# Patient Record
Sex: Male | Born: 1995 | Race: Black or African American | Hispanic: No | Marital: Single | Smoking: Current every day smoker
Health system: Southern US, Community
[De-identification: ages and names within clinical notes are randomized; demographics above are authoritative.]

## PROBLEM LIST (undated history)

## (undated) DIAGNOSIS — I10 Essential (primary) hypertension: Secondary | ICD-10-CM

---
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
## 2013-06-19 ENCOUNTER — Emergency Department: Payer: Self-pay | Admitting: Emergency Medicine

## 2013-06-19 LAB — CBC
HCT: 45.9 % (ref 40.0–52.0)
HGB: 15.2 g/dL (ref 13.0–18.0)
MCH: 31.7 pg (ref 26.0–34.0)
MCHC: 33.2 g/dL (ref 32.0–36.0)
MCV: 96 fL (ref 80–100)
Platelet: 188 10*3/uL (ref 150–440)
RBC: 4.81 10*6/uL (ref 4.40–5.90)
RDW: 11.9 % (ref 11.5–14.5)
WBC: 10.3 10*3/uL (ref 3.8–10.6)

## 2013-06-19 LAB — URINALYSIS, COMPLETE
Bacteria: NONE SEEN
Bilirubin,UR: NEGATIVE
Blood: NEGATIVE
Glucose,UR: NEGATIVE mg/dL (ref 0–75)
KETONE: NEGATIVE
LEUKOCYTE ESTERASE: NEGATIVE
Nitrite: NEGATIVE
Ph: 6 (ref 4.5–8.0)
Protein: NEGATIVE
RBC,UR: <1 /HPF (ref 0–5)
Specific Gravity: 1.015 (ref 1.003–1.030)
Squamous Epithelial: NONE SEEN
WBC UR: 1 /HPF (ref 0–5)

## 2013-06-19 LAB — DRUG SCREEN, URINE

## 2013-06-19 LAB — BASIC METABOLIC PANEL
ANION GAP: 3 — AB (ref 7–16)
BUN: 8 mg/dL (ref 7–18)
Calcium, Total: 8.7 mg/dL — ABNORMAL LOW (ref 9.0–10.7)
Chloride: 108 mmol/L — ABNORMAL HIGH (ref 98–107)
Co2: 30 mmol/L (ref 21–32)
Creatinine: 0.99 mg/dL (ref 0.60–1.30)
Glucose: 121 mg/dL — ABNORMAL HIGH (ref 65–99)
Osmolality: 281 (ref 275–301)
Potassium: 3.4 mmol/L — ABNORMAL LOW (ref 3.5–5.1)
SODIUM: 141 mmol/L (ref 136–145)

## 2013-08-25 ENCOUNTER — Emergency Department: Payer: Self-pay | Admitting: Emergency Medicine

## 2013-12-31 ENCOUNTER — Emergency Department: Payer: Self-pay | Admitting: Emergency Medicine

## 2014-01-07 LAB — HM HIV SCREENING LAB: HM HIV Screening: NEGATIVE

## 2015-02-25 ENCOUNTER — Observation Stay
Admission: EM | Admit: 2015-02-25 | Discharge: 2015-02-26 | Disposition: A | Discharge status: Home / Self Care | Payer: Self-pay | Attending: Surgery | Admitting: Surgery

## 2015-02-25 ENCOUNTER — Emergency Department: Payer: Self-pay

## 2015-02-25 ENCOUNTER — Encounter: Payer: Self-pay | Admitting: Radiology

## 2015-02-25 DIAGNOSIS — R109 Unspecified abdominal pain: Secondary | ICD-10-CM | POA: Diagnosis present

## 2015-02-25 DIAGNOSIS — F17210 Nicotine dependence, cigarettes, uncomplicated: Secondary | ICD-10-CM | POA: Insufficient documentation

## 2015-02-25 DIAGNOSIS — Z79899 Other long term (current) drug therapy: Secondary | ICD-10-CM | POA: Insufficient documentation

## 2015-02-25 DIAGNOSIS — I10 Essential (primary) hypertension: Secondary | ICD-10-CM | POA: Insufficient documentation

## 2015-02-25 DIAGNOSIS — D72829 Elevated white blood cell count, unspecified: Secondary | ICD-10-CM | POA: Insufficient documentation

## 2015-02-25 DIAGNOSIS — K353 Acute appendicitis with localized peritonitis, without perforation or gangrene: Secondary | ICD-10-CM

## 2015-02-25 DIAGNOSIS — R51 Headache: Secondary | ICD-10-CM | POA: Insufficient documentation

## 2015-02-25 DIAGNOSIS — R1031 Right lower quadrant pain: Principal | ICD-10-CM | POA: Insufficient documentation

## 2015-02-25 HISTORY — DX: Essential (primary) hypertension: I10

## 2015-02-25 LAB — URINALYSIS COMPLETE WITH MICROSCOPIC (ARMC ONLY)
BILIRUBIN URINE: NEGATIVE
Bacteria, UA: NONE SEEN
Glucose, UA: NEGATIVE mg/dL
Hgb urine dipstick: NEGATIVE
KETONES UR: NEGATIVE mg/dL
LEUKOCYTES UA: NEGATIVE
NITRITE: NEGATIVE
PH: 7 (ref 5.0–8.0)
PROTEIN: NEGATIVE mg/dL
SPECIFIC GRAVITY, URINE: 1.02 (ref 1.005–1.030)
Squamous Epithelial / LPF: NONE SEEN

## 2015-02-25 LAB — CBC WITH DIFFERENTIAL/PLATELET
Basophils Absolute: 0.1 10*3/uL (ref 0–0.1)
Basophils Relative: 1 %
EOS ABS: 0.1 10*3/uL (ref 0–0.7)
EOS PCT: 0 %
HCT: 43.4 % (ref 40.0–52.0)
Hemoglobin: 14.5 g/dL (ref 13.0–18.0)
LYMPHS ABS: 1.9 10*3/uL (ref 1.0–3.6)
Lymphocytes Relative: 11 %
MCH: 31.8 pg (ref 26.0–34.0)
MCHC: 33.3 g/dL (ref 32.0–36.0)
MCV: 95.5 fL (ref 80.0–100.0)
MONO ABS: 1.8 10*3/uL — AB (ref 0.2–1.0)
Monocytes Relative: 11 %
Neutro Abs: 13.7 10*3/uL — ABNORMAL HIGH (ref 1.4–6.5)
Neutrophils Relative %: 77 %
PLATELETS: 149 10*3/uL — AB (ref 150–440)
RBC: 4.55 MIL/uL (ref 4.40–5.90)
RDW: 11.5 % (ref 11.5–14.5)
WBC: 17.6 10*3/uL — AB (ref 3.8–10.6)

## 2015-02-25 LAB — COMPREHENSIVE METABOLIC PANEL
ALT: 70 U/L — AB (ref 17–63)
ANION GAP: 8 (ref 5–15)
AST: 48 U/L — ABNORMAL HIGH (ref 15–41)
Albumin: 4.9 g/dL (ref 3.5–5.0)
Alkaline Phosphatase: 130 U/L — ABNORMAL HIGH (ref 38–126)
BUN: 10 mg/dL (ref 6–20)
CHLORIDE: 101 mmol/L (ref 101–111)
CO2: 29 mmol/L (ref 22–32)
Calcium: 9.4 mg/dL (ref 8.9–10.3)
Creatinine, Ser: 0.93 mg/dL (ref 0.61–1.24)
GFR calc non Af Amer: >60 mL/min (ref >60)
Glucose, Bld: 97 mg/dL (ref 65–99)
POTASSIUM: 3.9 mmol/L (ref 3.5–5.1)
SODIUM: 138 mmol/L (ref 135–145)
Total Bilirubin: 0.8 mg/dL (ref 0.3–1.2)
Total Protein: 8.5 g/dL — ABNORMAL HIGH (ref 6.5–8.1)

## 2015-02-25 LAB — LIPASE, BLOOD: LIPASE: 20 U/L (ref 11–51)

## 2015-02-25 MED ORDER — CEFOXITIN SODIUM 2 G IV SOLR: 2.0000 g | Freq: Once | INTRAVENOUS | Status: DC

## 2015-02-25 MED ORDER — ACETAMINOPHEN 325 MG PO TABS
ORAL_TABLET | ORAL | Status: AC
  Filled 2015-02-25: qty 2

## 2015-02-25 MED ORDER — MORPHINE SULFATE (PF) 4 MG/ML IV SOLN
4.0000 mg | Freq: Once | INTRAVENOUS | Status: AC
  Administered 2015-02-25: 4 mg via INTRAVENOUS
  Filled 2015-02-25: qty 1

## 2015-02-25 MED ORDER — PROCHLORPERAZINE EDISYLATE 5 MG/ML IJ SOLN
10.0000 mg | Freq: Once | INTRAMUSCULAR | Status: AC
  Administered 2015-02-25: 10 mg via INTRAVENOUS

## 2015-02-25 MED ORDER — SODIUM CHLORIDE 0.9 % IV BOLUS (SEPSIS)
1000.0000 mL | Freq: Once | INTRAVENOUS | Status: AC
  Administered 2015-02-25: 1000 mL via INTRAVENOUS

## 2015-02-25 MED ORDER — IOHEXOL 240 MG/ML SOLN
25.0000 mL | Freq: Once | INTRAMUSCULAR | Status: AC | PRN
  Administered 2015-02-25: 25 mL via ORAL
  Filled 2015-02-25: qty 25

## 2015-02-25 MED ORDER — SODIUM CHLORIDE 0.9 % IV BOLUS (SEPSIS): 1000.0000 mL | Freq: Once | INTRAVENOUS | Status: DC

## 2015-02-25 MED ORDER — DEXTROSE 5 % IV SOLN: 2.0000 g | INTRAVENOUS | Status: DC

## 2015-02-25 MED ORDER — PROCHLORPERAZINE EDISYLATE 5 MG/ML IJ SOLN
INTRAMUSCULAR | Status: AC
  Filled 2015-02-25: qty 2

## 2015-02-25 MED ORDER — ACETAMINOPHEN 325 MG PO TABS
650.0000 mg | ORAL_TABLET | Freq: Once | ORAL | Status: AC
  Administered 2015-02-25: 650 mg via ORAL

## 2015-02-25 MED ORDER — ONDANSETRON HCL 4 MG/2ML IJ SOLN
4.0000 mg | Freq: Once | INTRAMUSCULAR | Status: AC
  Administered 2015-02-25: 4 mg via INTRAVENOUS
  Filled 2015-02-25: qty 2

## 2015-02-25 MED ORDER — IOHEXOL 300 MG/ML  SOLN
100.0000 mL | Freq: Once | INTRAMUSCULAR | Status: AC | PRN
  Administered 2015-02-25: 100 mL via INTRAVENOUS
  Filled 2015-02-25: qty 100

## 2015-02-25 NOTE — Progress Notes (Signed)
Patient met in the emergency room at the request of the ED physician.. Patient's history is obtained and it is clear that while he has right lower quadrant pain and tenderness with leukocytosis and low-grade fever he's had similar episodes like this in the past he is been added in the emergency room recently with an elevated white blood cell count nausea vomiting and abdominal pain with that in mind I've suggested that we obtain a CT scan and I will see the patient after that should he be showing signs of appendicitis obviously he will required mission an appendectomy.

## 2015-02-25 NOTE — H&P (Signed)
Clinton Campbell is an 19 y.o. male.    Chief Complaint: Headache and abdominal pain  HPI: This a patient is chief complaint is headache and abdominal pain he's had some nausea but no emesis. He points to the suprapubic area is the site of maximal tenderness. I was called to see the patient concerning possible appendicitis and ultimately after seeing him in a CT scan was ordered which was equivocal. On review of the patient's past history in the chart it appears that he has been here approximately once a year for very similar symptoms where he's had a headache nausea vomiting and abdominal pain and in fact on one of those occasions he had a leukocytosis as well. His mother states that he has frequent headaches and has not been noncompliant with his hypertension medications. She also states that he shakes all the time and is worried about a neurologic problem.  History reviewed. No pertinent past medical history.  No past surgical history on file.  No family history on file. Social History:  has no tobacco, alcohol, and drug history on file.  Allergies: No Known Allergies   (Not in a hospital admission)   Review of Systems  Constitutional: Positive for fever and chills. Negative for weight loss and malaise/fatigue.  HENT: Negative for ear discharge, ear pain, hearing loss and tinnitus.        Neck pain neck stiffness  Eyes: Negative.   Respiratory: Negative.   Cardiovascular: Negative.   Gastrointestinal: Positive for nausea, abdominal pain and diarrhea. Negative for heartburn, vomiting, constipation, blood in stool and melena.  Genitourinary: Negative.   Musculoskeletal: Negative.   Skin: Negative.   Neurological: Positive for headaches.  Endo/Heme/Allergies: Negative.   Psychiatric/Behavioral: Negative.      Physical Exam:  BP 168/96 mmHg  Pulse 94  Temp(Src) 100.4 F (38 C) (Oral)  Ht _0  (1.676 m)  Wt 175 lb (79.379 kg)  BMI 28.26 kg/m2  SpO2 100%  Physical  Exam  Constitutional: He is oriented to person, place, and time and well-developed, well-nourished, and in no distress. No distress.  Appears quite comfortable  HENT:  Head: Normocephalic and atraumatic.  Eyes: Pupils are equal, round, and reactive to light. Right eye exhibits no discharge. Left eye exhibits no discharge. No scleral icterus.  Neck: Normal range of motion. Neck supple.  No nuchal rigidity  Cardiovascular: Normal rate, regular rhythm and normal heart sounds.   Pulmonary/Chest: Effort normal and breath sounds normal. No respiratory distress. He has no wheezes. He has no rales.  Abdominal: Soft. He exhibits no distension. There is tenderness. There is no rebound and no guarding.  Tenderness maximal in the right lower quadrant but not specifically localized to McBurney's point. There is no guarding and no rebound tenderness  Musculoskeletal: Normal range of motion. He exhibits no edema.  Lymphadenopathy:    He has no cervical adenopathy.  Neurological: He is alert and oriented to person, place, and time.  Skin: Skin is warm and dry. No rash noted. He is not diaphoretic. No erythema.  Psychiatric: Mood and affect normal.  Vitals reviewed.       Results for orders placed or performed during the hospital encounter of 02/25/15 (from the past 48 hour(s))  CBC WITH DIFFERENTIAL     Status: Abnormal   Collection Time: 02/25/15  8:36 PM  Result Value Ref Range   WBC 17.6 (H) 3.8 - 10.6 K/uL   RBC 4.55 4.40 - 5.90 MIL/uL   Hemoglobin 14.5 13.0 -  18.0 g/dL   HCT 43.4 40.0 - 52.0 %   MCV 95.5 80.0 - 100.0 fL   MCH 31.8 26.0 - 34.0 pg   MCHC 33.3 32.0 - 36.0 g/dL   RDW 11.5 11.5 - 14.5 %   Platelets 149 (L) 150 - 440 K/uL   Neutrophils Relative % 77 %   Neutro Abs 13.7 (H) 1.4 - 6.5 K/uL   Lymphocytes Relative 11 %   Lymphs Abs 1.9 1.0 - 3.6 K/uL   Monocytes Relative 11 %   Monocytes Absolute 1.8 (H) 0.2 - 1.0 K/uL   Eosinophils Relative 0 %   Eosinophils Absolute 0.1 0 -  0.7 K/uL   Basophils Relative 1 %   Basophils Absolute 0.1 0 - 0.1 K/uL  Comprehensive metabolic panel     Status: Abnormal   Collection Time: 02/25/15  8:36 PM  Result Value Ref Range   Sodium 138 135 - 145 mmol/L   Potassium 3.9 3.5 - 5.1 mmol/L   Chloride 101 101 - 111 mmol/L   CO2 29 22 - 32 mmol/L   Glucose, Bld 97 65 - 99 mg/dL   BUN 10 6 - 20 mg/dL   Creatinine, Ser 0.93 0.61 - 1.24 mg/dL   Calcium 9.4 8.9 - 10.3 mg/dL   Total Protein 8.5 (H) 6.5 - 8.1 g/dL   Albumin 4.9 3.5 - 5.0 g/dL   AST 48 (H) 15 - 41 U/L   ALT 70 (H) 17 - 63 U/L   Alkaline Phosphatase 130 (H) 38 - 126 U/L   Total Bilirubin 0.8 0.3 - 1.2 mg/dL   GFR calc non Af Amer >60 >60 mL/min   GFR calc Af Amer >60 >60 mL/min    Comment: (NOTE) The eGFR has been calculated using the CKD EPI equation. This calculation has not been validated in all clinical situations. eGFR's persistently <60 mL/min signify possible Chronic Kidney Disease.    Anion gap 8 5 - 15  Lipase, blood     Status: None   Collection Time: 02/25/15  8:36 PM  Result Value Ref Range   Lipase 20 11 - 51 U/L  Urinalysis complete, with microscopic ([facility] only)     Status: Abnormal   Collection Time: 02/25/15  8:36 PM  Result Value Ref Range   Color, Urine YELLOW (A) YELLOW   APPearance CLEAR (A) CLEAR   Glucose, UA NEGATIVE NEGATIVE mg/dL   Bilirubin Urine NEGATIVE NEGATIVE   Ketones, ur NEGATIVE NEGATIVE mg/dL   Specific Gravity, Urine 1.020 1.005 - 1.030   Hgb urine dipstick NEGATIVE NEGATIVE   pH 7.0 5.0 - 8.0   Protein, ur NEGATIVE NEGATIVE mg/dL   Nitrite NEGATIVE NEGATIVE   Leukocytes, UA NEGATIVE NEGATIVE   RBC / HPF 0-5 0 - 5 RBC/hpf   WBC, UA 0-5 0 - 5 WBC/hpf   Bacteria, UA NONE SEEN NONE SEEN   Squamous Epithelial / LPF NONE SEEN NONE SEEN   Mucous PRESENT    Ct Abdomen Pelvis W Contrast  02/25/2015  CLINICAL DATA:  19 year old male with right lower quadrant abdominal pain and leukocytosis. EXAM: CT ABDOMEN AND PELVIS  WITH CONTRAST TECHNIQUE: Multidetector CT imaging of the abdomen and pelvis was performed using the standard protocol following bolus administration of intravenous contrast. CONTRAST:  19m OMNIPAQUE IOHEXOL 300 MG/ML  SOLN COMPARISON:  None. FINDINGS: The visualized lung bases are clear. No intra-abdominal free air or free fluid. There multiple hepatic hypodense lesions measuring up to 1.9 x 1.9 cm in the  left lobe of the liver these lesions are not well characterized on this study but may represent hemangiomas. MRI is recommended for further characterization. The gallbladder, pancreas, spleen, adrenal glands, kidneys, visualized ureters, and urinary bladder appear unremarkable. The prostate and seminal vesicles are grossly unremarkable. There is no evidence of bowel obstruction or inflammation. The appendix is minimally enlarged measuring up to 8 mm in diameter. There is minimal haziness of the wall of the appendix without significant periappendiceal inflammation. And pockets of air noted within the lumen of the appendix as well as within the tip of the appendix. Although no definite CT evidence of acute appendicitis identified at this time, an early acute appendicitis is not excluded. Clinical correlation and follow-up recommended. The abdominal aorta and IVC appear patent. There is a retro aortic left renal vein variant anatomy. No portal venous gas identified. There is no adenopathy. There is slight prominence of the distal mesentery vasculature, nonspecific. The abdominal wall soft tissues appear unremarkable. The osseous structures are intact. IMPRESSION: Minimal thickening of the appendix without definite CT evidence of acute appendicitis at this time. An early acute appendicitis is not excluded. Clinical correlation and follow-up recommended. No evidence of bowel obstruction. Multiple hepatic hypodense lesions, not characterized on this CT. MRI is recommended for further evaluation. Electronically Signed    By: Anner Crete M.D.   On: 02/25/2015 23:15     Assessment/Plan  This a patient who when I visited him the second time his chief complaint became is headache and stated that his abdominal pain was much better. He scan was obtained and has been personally reviewed it is equivocal for appendicitis without any obvious signs of appendicitis. The patient has right lower quadrant pain and tenderness leukocytosis and equivocal CT scan and with that in mind he warrants admission to the hospital with observation. I'm concerned that he could have appendicitis and may require laparoscopy and appendectomy. However at this point reviewing his records and the fact that he's had other admissions to the emergency room with similar complaints over the last 2 years suggested this could be something different and in fact he has a low-grade fever without CT findings of appendicitis and headache makes me think that this is something other than simple appendicitis. I will last for prime doc consultation as well if for nothing else for his hypertension. But I would like him to give an opinion concerning his headaches etc.  If he requires laparoscopy that would be based on worsening abdominal pain and tenderness. This is discussed at length with emergency room physician. I also discussed this with multiple family members and answered questions for them  Florene Glen, MD, FACS

## 2015-02-25 NOTE — ED Notes (Signed)
Pt in with co headache also co upper abd pain, denies any n.v.d.

## 2015-02-25 NOTE — ED Notes (Signed)
Pt reports being d/x'd with HTN, but non-compliant with prescribed medication (HCTZ). Pt also reports RIGHT-sided upper and lower abdominal pain w/o N/V/D.

## 2015-02-25 NOTE — ED Provider Notes (Addendum)
[facility] Emergency Department Provider Note  ____________________________________________  Time seen: Approximately 905 PM  I have reviewed the triage vital signs and the nursing notes.   HISTORY  Chief Complaint Abdominal Pain    HPI Clinton Campbell is a 19 y.o. male without any chronic medical problems who is presenting with 2 days of intermittent right lower quadrant abdominal pain and headache. He denies any nausea vomiting or diarrhea. Says that he has had accompanying shaking chills along with the abdominal pain. He says at this point that the pain is 4 out of 5 into his right lower quadrant and radiates occasionally to his back and up to the right upper quadrant. He says that the headache comes with the shaking chills and is to the left side of his head but sometimes radiates into a generalized headache. He says it is a 5 out of 10 pain right now. The headache as well as been coming and going. No neck pain or difficulty moving his neck.   No past medical history on file.  There are no active problems to display for this patient.   No past surgical history on file.  No current outpatient prescriptions on file.  Allergies Review of patient's allergies indicates no known allergies.  No family history on file.  Social History Social History  Substance Use Topics  . Smoking status: Not on file  . Smokeless tobacco: Not on file  . Alcohol Use: Not on file    Review of Systems Constitutional: Shaking chills  Eyes: No visual changes. ENT: No sore throat. Cardiovascular: Denies chest pain. Respiratory: Denies shortness of breath. Gastrointestinal:  No nausea, no vomiting.  No diarrhea.  No constipation. Genitourinary: Negative for dysuria. Musculoskeletal: Negative for back pain. Skin: Negative for rash. Neurological: Negative for focal weakness or numbness.  10-point ROS otherwise  negative.  ____________________________________________   PHYSICAL EXAM:  VITAL SIGNS: ED Triage Vitals  Enc Vitals Group     BP 02/25/15 2027 168/96 mmHg     Pulse Rate 02/25/15 2027 94     Resp --      Temp 02/25/15 2027 100.4 F (38 C)     Temp Source 02/25/15 2027 Oral     SpO2 02/25/15 2027 100 %     Weight 02/25/15 2027 175 lb (79.379 kg)     Height 02/25/15 2027 5\' 6"  (1.676 m)     Head Cir --      Peak Flow --      Pain Score 02/25/15 2030 6     Pain Loc --      Pain Edu? --      Excl. in GC? --     Constitutional: Alert and oriented. Well appearing and in no acute distress. Eyes: Conjunctivae are normal. PERRL. EOMI. Head: Atraumatic. Nose: No congestion/rhinnorhea. Mouth/Throat: Mucous membranes are moist.  Oropharynx non-erythematous. Neck: No stridor.   Cardiovascular: Normal rate, regular rhythm. Grossly normal heart sounds.  Good peripheral circulation. Respiratory: Normal respiratory effort.  No retractions. Lungs CTAB. Gastrointestinal: Soft with right lower quadrant tenderness over McBurney's point. There is no right upper quadrant tenderness or Murphy sign. No distention. No abdominal bruits. No CVA tenderness. Musculoskeletal: No lower extremity tenderness nor edema.  No joint effusions. Neurologic:  Normal speech and language. No gross focal neurologic deficits are appreciated. No gait instability. Skin:  Skin is warm, dry and intact. No rash noted. Psychiatric: Mood and affect are normal. Speech and behavior are normal.  ____________________________________________  LABS (all labs ordered are listed, but only abnormal results are displayed)  Labs Reviewed  CBC WITH DIFFERENTIAL/PLATELET - Abnormal; Notable for the following:    WBC 17.6 (*)    Platelets 149 (*)    Neutro Abs 13.7 (*)    Monocytes Absolute 1.8 (*)    All other components within normal limits  URINALYSIS COMPLETEWITH MICROSCOPIC ([facility] ONLY) - Abnormal; Notable for the  following:    Color, Urine YELLOW (*)    APPearance CLEAR (*)    All other components within normal limits  COMPREHENSIVE METABOLIC PANEL  LIPASE, BLOOD   ____________________________________________  EKG   ____________________________________________  RADIOLOGY   ____________________________________________   PROCEDURES  ____________________________________________   INITIAL IMPRESSION / ASSESSMENT AND PLAN / ED COURSE  Pertinent labs & imaging results that were available during my care of the patient were reviewed by me and considered in my medical decision making (see chart for details).  ----------------------------------------- 9:20 PM on 02/25/2015 -----------------------------------------  Discussed case with Dr. Excell Seltzer that the patient has clinical appendicitis. I believe that the headaches are related to his low-grade fever. Dr. Excell Seltzer will be down to the patient. I discussed the diagnosis with the patient understands the plan for likely admission for surgery. He has no allergies to medication and he'll be given pain medicine as well as antiemetics and antibiotics. ____________________________________________   FINAL CLINICAL IMPRESSION(S) / ED DIAGNOSES  Acute appendicitis.    Myrna Blazer, MD 02/25/15 2120  Discussed with radiologist who says that the scan was equivocal. Relayed this information to Dr. Excell Seltzer who initially did not want to take the patient directed to the operating room because he said he had a similar visit several years ago with an elevated white count and did not want a empirically remove the appendix. Patient is aware of CAT scan results not showing definitive appendicitis the need for further observation. Patient is resting comfortably right now is not requesting any other further pain medications.  Myrna Blazer, MD 02/25/15 2310  Patient now with improved abdominal pain but worsening headache. Reexamined and no  neck pain able to fully range his neck without any meningismus or pain to the neck. No Kernig's or Brudzinski sign. He is fully mentating without any focal deficits. Discussed this with Dr. Excell Seltzer and says he will be getting a medicine consult. Patient with multiple symptoms including abdominal pain and headache but without any focal signs of meningismus in his neck.  Myrna Blazer, MD 02/25/15 [phone]

## 2015-02-26 ENCOUNTER — Encounter: Payer: Self-pay | Admitting: *Deleted

## 2015-02-26 DIAGNOSIS — R51 Headache: Secondary | ICD-10-CM

## 2015-02-26 DIAGNOSIS — K353 Acute appendicitis with localized peritonitis, without perforation or gangrene: Secondary | ICD-10-CM | POA: Insufficient documentation

## 2015-02-26 DIAGNOSIS — R1031 Right lower quadrant pain: Secondary | ICD-10-CM

## 2015-02-26 DIAGNOSIS — R519 Headache, unspecified: Secondary | ICD-10-CM | POA: Insufficient documentation

## 2015-02-26 DIAGNOSIS — I10 Essential (primary) hypertension: Secondary | ICD-10-CM | POA: Insufficient documentation

## 2015-02-26 DIAGNOSIS — R109 Unspecified abdominal pain: Secondary | ICD-10-CM | POA: Diagnosis present

## 2015-02-26 MED ORDER — AMOXICILLIN-POT CLAVULANATE 500-125 MG PO TABS
1.0000 | ORAL_TABLET | Freq: Three times a day (TID) | ORAL | Status: DC
  Administered 2015-02-26: 500 mg via ORAL
  Filled 2015-02-26: qty 1

## 2015-02-26 MED ORDER — LACTATED RINGERS IV SOLN
INTRAVENOUS | Status: DC
  Administered 2015-02-26 (×2): via INTRAVENOUS

## 2015-02-26 MED ORDER — ONDANSETRON HCL 4 MG PO TABS: 4.0000 mg | ORAL_TABLET | Freq: Four times a day (QID) | ORAL | Status: DC | PRN

## 2015-02-26 MED ORDER — HYDROMORPHONE HCL 1 MG/ML IJ SOLN: 1.0000 mg | INTRAMUSCULAR | Status: DC | PRN

## 2015-02-26 MED ORDER — AMOXICILLIN-POT CLAVULANATE 500-125 MG PO TABS: 1.0000 | ORAL_TABLET | Freq: Three times a day (TID) | ORAL | Status: DC

## 2015-02-26 MED ORDER — INFLUENZA VAC SPLIT QUAD 0.5 ML IM SUSY: 0.5000 mL | PREFILLED_SYRINGE | INTRAMUSCULAR | Status: DC

## 2015-02-26 MED ORDER — PROPRANOLOL HCL ER 80 MG PO CP24
80.0000 mg | ORAL_CAPSULE | Freq: Every day | ORAL | Status: DC
  Administered 2015-02-26: 80 mg via ORAL
  Filled 2015-02-26: qty 1

## 2015-02-26 MED ORDER — PIPERACILLIN-TAZOBACTAM 3.375 G IVPB
3.3750 g | Freq: Three times a day (TID) | INTRAVENOUS | Status: DC
  Administered 2015-02-26 (×2): 3.375 g via INTRAVENOUS
  Filled 2015-02-26 (×4): qty 50

## 2015-02-26 MED ORDER — ACETAMINOPHEN 325 MG PO TABS: 650.0000 mg | ORAL_TABLET | ORAL | Status: DC | PRN

## 2015-02-26 MED ORDER — ONDANSETRON HCL 4 MG/2ML IJ SOLN: 4.0000 mg | Freq: Four times a day (QID) | INTRAMUSCULAR | Status: DC | PRN

## 2015-02-26 MED ORDER — PIPERACILLIN-TAZOBACTAM 3.375 G IVPB: 3.3750 g | Freq: Three times a day (TID) | INTRAVENOUS | Status: DC

## 2015-02-26 NOTE — Progress Notes (Signed)
Patient ID: Clinton Campbell, male   DOB: 07/07/1995, 19 y.o.   MRN: [id number] [facility]   PATIENT NAME: Clinton Campbell    MR#:  [id number]  DATE OF BIRTH:  12/11/1995  SUBJECTIVE:   Patient denies any abdominal pain. He continues to have a headache. medicine evaluation.  No nausea no vomiting. REVIEW OF SYSTEMS:   Review of Systems  Constitutional: Positive for fever.  Eyes: Negative for blurred vision and double vision.  Gastrointestinal: Negative for nausea, vomiting and abdominal pain.  Genitourinary: Negative for dysuria.  Neurological: Positive for headaches.    DRUG ALLERGIES:  No Known Allergies  VITALS:  Blood pressure 137/68, pulse 87, temperature 99.1 F (37.3 C), temperature source Oral, resp. rate 20, height 5\' 6"  (1.676 m), weight 184 lb 3.2 oz (83.553 kg), SpO2 98 %.  I/O last 3 completed shifts: In: 971 [I.V.:462; IV Piggyback:509] Out: 850 [Urine:850] Total I/O In: 467 [I.V.:467] Out: -    PHYSICAL EXAMINATION:  Physical Exam  Constitutional: He is oriented to person, place, and time and well-developed, well-nourished, and in no distress. No distress.  HENT:  Head: Normocephalic and atraumatic.  Eyes: Pupils are equal, round, and reactive to light. No scleral icterus.  Abdominal: Soft. He exhibits no distension. There is no tenderness. There is no rebound.  Neurological: He is oriented to person, place, and time.  Skin: He is not diaphoretic.  Psychiatric: Affect normal.      ASSESSMENT AND PLAN:   19 year old male with poorly controlled hypertension recurrent headaches abdominal pain and leukocytosis and a CT scan which is equivocal for acute appendicitis.  His examination of the abdomen is benign.  At present I do not see an indication for diagnostic laparoscopy.

## 2015-02-26 NOTE — ED Notes (Signed)
Report called [facility] floor nurse kelly rn.  Iv in place.  Family with pt.

## 2015-02-26 NOTE — Consult Note (Signed)
[facility]  Medical Consultation  [name] ZOX:[id number] DOB: 07/05/1995 DOA: 02/25/2015 PCP: [facility]   Requesting physician:  Dionne Miloichard Cooper Date of consultation: 02/26/15 Reason for consultation:  HA  CHIEF COMPLAINT:   Chief Complaint  Patient presents with  . Abdominal Pain    HISTORY OF PRESENT ILLNESS: Clinton Campbell  is a 19 y.o. male with a known history of  hypertension who states that he has not been taking his blood pressure medications was admitted by surgery for abdominal pain and CT suggested possible appendicitis. Patient reports that he has a history of having headaches intermittently every few days. He describes it as sharp type of frontal headaches. He also reports that he has visual changes with blurred vision with these. He reports that he also has difficulty with seeing far away. However he has not had eye exam. Patient also reports that he has episodes of shaking every few days he's been having this since he was very young. His mother requested neurology evaluation earlier. PAST MEDICAL HISTORY:   Past Medical History  Diagnosis Date  . Hypertension     PAST SURGICAL HISTORY: History reviewed. No pertinent past surgical history.  SOCIAL HISTORY:  Social History  Substance Use Topics  . Smoking status: Current Every Day Smoker -- 0.50 packs/day    Types: Cigarettes  . Smokeless tobacco: Not on file  . Alcohol Use: Not on file    FAMILY HISTORY: History reviewed. No pertinent family history.  DRUG ALLERGIES: No Known Allergies  REVIEW OF SYSTEMS:   CONSTITUTIONAL: No fever, fatigue or weakness.  EYES: Positive blurred , no double vision.  EARS, NOSE, AND THROAT: No tinnitus or ear pain.  RESPIRATORY: No cough, shortness of breath, wheezing or hemoptysis.  CARDIOVASCULAR: No chest pain, orthopnea, edema.  GASTROINTESTINAL: No nausea, vomiting, diarrhea or abdominal pain.  GENITOURINARY: No dysuria, hematuria.  ENDOCRINE:  No polyuria, nocturia,  HEMATOLOGY: No anemia, easy bruising or bleeding SKIN: No rash or lesion. MUSCULOSKELETAL: No joint pain or arthritis.   NEUROLOGIC: No tingling, numbness, weakness. Complains of intermittent sharp headaches PSYCHIATRY: No anxiety or depression.   MEDICATIONS AT HOME:  Prior to Admission medications   Medication Sig Start Date End Date Taking? Authorizing Provider  acetaminophen (TYLENOL) 325 MG tablet Take 650 mg by mouth every 6 (six) hours as needed.   Yes Historical Provider, MD      PHYSICAL EXAMINATION:   VITAL SIGNS: Blood pressure 132/71, pulse 114, temperature 99.4 F (37.4 C), temperature source Oral, resp. rate 20, height 5\' 6"  (1.676 m), weight 83.553 kg (184 lb 3.2 oz), SpO2 99 %.  GENERAL:  19 y.o.-year-old patient lying in the bed with no acute distress.  EYES: Pupils equal, round, reactive to light and accommodation. No scleral icterus. Extraocular muscles intact.  HEENT: Head atraumatic, normocephalic. Oropharynx and nasopharynx clear.  NECK:  Supple, no jugular venous distention. No thyroid enlargement, no tenderness.  LUNGS: Normal breath sounds bilaterally, no wheezing, rales,rhonchi or crepitation. No use of accessory muscles of respiration.  CARDIOVASCULAR: S1, S2 normal. No murmurs, rubs, or gallops.  ABDOMEN: Soft, nontender, nondistended. Bowel sounds present. No organomegaly or mass.  EXTREMITIES: No pedal edema, cyanosis, or clubbing.  NEUROLOGIC: Cranial nerves II through XII are intact. Muscle strength 5/5 in all extremities. Sensation intact. Gait not checked.  PSYCHIATRIC: The patient is alert and oriented x 3.  SKIN: No obvious rash, lesion, or ulcer.   LABORATORY PANEL:   CBC  Recent Labs Lab  02/25/15 2036  WBC 17.6*  HGB 14.5  HCT 43.4  PLT 149*  MCV 95.5  MCH 31.8  MCHC 33.3  RDW 11.5  LYMPHSABS 1.9  MONOABS 1.8*  EOSABS 0.1  BASOSABS 0.1    ------------------------------------------------------------------------------------------------------------------  Chemistries   Recent Labs Lab 02/25/15 2036  NA 138  K 3.9  CL 101  CO2 29  GLUCOSE 97  BUN 10  CREATININE 0.93  CALCIUM 9.4  AST 48*  ALT 70*  ALKPHOS 130*  BILITOT 0.8   ------------------------------------------------------------------------------------------------------------------ estimated creatinine clearance is 127.4 mL/min (by C-G formula based on Cr of 0.93). ------------------------------------------------------------------------------------------------------------------ No results for input(s): TSH, T4TOTAL, T3FREE, THYROIDAB in the last 72 hours.  Invalid input(s): FREET3   Coagulation profile No results for input(s): INR, PROTIME in the last 168 hours. ------------------------------------------------------------------------------------------------------------------- No results for input(s): DDIMER in the last 72 hours. -------------------------------------------------------------------------------------------------------------------  Cardiac Enzymes No results for input(s): CKMB, TROPONINI, MYOGLOBIN in the last 168 hours.  Invalid input(s): CK ------------------------------------------------------------------------------------------------------------------ Invalid input(s): POCBNP  ---------------------------------------------------------------------------------------------------------------  Urinalysis    Component Value Date/Time   COLORURINE YELLOW* 02/25/2015 2036   COLORURINE Yellow 06/19/2013 2258   APPEARANCEUR CLEAR* 02/25/2015 2036   APPEARANCEUR Clear 06/19/2013 2258   LABSPEC 1.020 02/25/2015 2036   LABSPEC 1.015 06/19/2013 2258   PHURINE 7.0 02/25/2015 2036   PHURINE 6.0 06/19/2013 2258   GLUCOSEU NEGATIVE 02/25/2015 2036   GLUCOSEU Negative 06/19/2013 2258   HGBUR NEGATIVE 02/25/2015 2036   HGBUR Negative  06/19/2013 2258   BILIRUBINUR NEGATIVE 02/25/2015 2036   BILIRUBINUR Negative 06/19/2013 2258   KETONESUR NEGATIVE 02/25/2015 2036   KETONESUR Negative 06/19/2013 2258   PROTEINUR NEGATIVE 02/25/2015 2036   PROTEINUR Negative 06/19/2013 2258   NITRITE NEGATIVE 02/25/2015 2036   NITRITE Negative 06/19/2013 2258   LEUKOCYTESUR NEGATIVE 02/25/2015 2036   LEUKOCYTESUR Negative 06/19/2013 2258     RADIOLOGY: Ct Abdomen Pelvis W Contrast  02/25/2015  CLINICAL DATA:  20 year old male with right lower quadrant abdominal pain and leukocytosis. EXAM: CT ABDOMEN AND PELVIS WITH CONTRAST TECHNIQUE: Multidetector CT imaging of the abdomen and pelvis was performed using the standard protocol following bolus administration of intravenous contrast. CONTRAST:  OMNIPAQUE IOHEXOL 300 MG/ML  SOLN COMPARISON:  None. FINDINGS: The visualized lung bases are clear. No intra-abdominal free air or free fluid. There multiple hepatic hypodense lesions measuring up to 1.9 x 1.9 cm in the left lobe of the liver these lesions are not well characterized on this study but may represent hemangiomas. MRI is recommended for further characterization. The gallbladder, pancreas, spleen, adrenal glands, kidneys, visualized ureters, and urinary bladder appear unremarkable. The prostate and seminal vesicles are grossly unremarkable. There is no evidence of bowel obstruction or inflammation. The appendix is minimally enlarged measuring up to 8 mm in diameter. There is minimal haziness of the wall of the appendix without significant periappendiceal inflammation. And pockets of air noted within the lumen of the appendix as well as within the tip of the appendix. Although no definite CT evidence of acute appendicitis identified at this time, an early acute appendicitis is not excluded. Clinical correlation and follow-up recommended. The abdominal aorta and IVC appear patent. There is a retro aortic left renal vein variant anatomy. No  portal venous gas identified. There is no adenopathy. There is slight prominence of the distal mesentery vasculature, nonspecific. The abdominal wall soft tissues appear unremarkable. The osseous structures are intact. IMPRESSION: Minimal thickening of the appendix without definite CT evidence of acute appendicitis at this time. An  early acute appendicitis is not excluded. Clinical correlation and follow-up recommended. No evidence of bowel obstruction. Multiple hepatic hypodense lesions, not characterized on this CT. MRI is recommended for further evaluation. Electronically Signed   By: Elgie Collard M.D.   On: 02/25/2015 23:15    EKG: Orders placed or performed in visit on 06/19/13  . EKG 12-Lead    IMPRESSION AND PLAN: Patient is a 19 year old admitted with abdominal pain who has chronic intermittent headaches  1. Headache differential include due to poor vision needs eye exam, also could be migraines patient describes some sort of shaking episodes but this is something that is been going on for a long time. We will ask neurology to see if there is any other therapies that could be offered. For now can use Tylenol when necessary.  2. Hypertension apparently patient was supposed to be on HCTZ but with this headache may use a blood pressure medication like propranolol that would also help prevent headaches as well.   3. Abdominal pain as per surgery  4. Nicotine addiction smoking cessation provided to the patient for minutes spent he is not interested in quitting not interested in nicotine replacement therapy.    All the records are reviewed and case discussed with ED provider. Management plans discussed with the patient, family and they are in agreement.  CODE STATUS:    Code Status Orders        Start     Ordered   02/26/15 0006  Full code   Continuous     02/26/15 0007       TOTAL TIME TAKING CARE OF THIS PATIENT:28min.    Auburn Bilberry M.D on 02/26/2015 at 1:12  PM  Between 7am to 6pm - Pager - [phone]  After 6pm go to [email or web address] - password [email or web address] [facility]  [place] [facility]  Office  [phone]  CC: Primary care physician; [facility]

## 2015-02-26 NOTE — Progress Notes (Signed)
Patient discharge teaching given, including activity, diet, follow-up appoints, and medications. Patient verbalized understanding of all discharge instructions. IV access was d/c'd. Vitals are stable. Skin is intact except as charted in most recent assessments. Pt to be escorted out by family, to be driven home by family.  Karsten RoLauren E Hobbs

## 2015-02-26 NOTE — Progress Notes (Signed)
No abdominal pain  Tolerating clears  IM appreciated  abd soft  Home today on 7 days augmentin  Resume home meds for BP.

## 2015-02-26 NOTE — Care Management Note (Signed)
Case Management Note  Patient Details  Name: Clinton Campbell MRN: [id number] Date of Birth: 07/24/1995  Subjective/Objective:      Provided uninsured Mr Clinton Campbell with a list of local medical and medication clinics who serve the uninsured. Also provided Mr Clinton Campbell [facility] street [facility]. Mr Clinton Campbell verbalized understanding. No discharge orders at this time.               Action/Plan:   Expected Discharge Date:                  Expected Discharge Plan:     In-House Referral:     Discharge planning Services     Post Acute Care Choice:    Choice offered to:     DME Arranged:    DME Agency:     HH Arranged:    HH Agency:     Status of Service:     Medicare Important Message Given:    Date Medicare IM Given:    Medicare IM give by:    Date Additional Medicare IM Given:    Additional Medicare Important Message give by:     If discussed at Long Length of Stay Meetings, dates discussed:    Additional Comments:  Gary Gabrielsen A, RN 02/26/2015, 12:15 PM

## 2015-02-26 NOTE — Progress Notes (Signed)
The patient is given a work excuse note date 12-22 and 12-23 2016   Raynald KempMark A Montrice Gracey, MD  [facility].  [phone]

## 2015-02-26 NOTE — Clinical Social Work Note (Signed)
CSW consulted to assist with medications. This has been referred to RN CM. Please reconsult if CSW is needed. York SpanielMonica Yulian Gosney MSW,LCSW [phone]

## 2015-03-09 NOTE — Discharge Summary (Signed)
Physician Discharge Summary  Patient ID: Clinton Campbell MRN: [id number] DOB/AGE: 22/06/1995 19 y.o.  Admit date: 02/25/2015 Discharge date:02/26/2015  Admission Diagnoses: Abdominal pain Headaches hypertension  Discharge Diagnoses:  Active Problems:   Abdominal pain Headaches. Hypertension.  Discharged Condition: stable  Hospital Course: Admitted with recurrent abdominal pain and headaches.  Clinical impression was not that of appendicitis.  Internal medicine saw him and adjusted his hypertension medications.  Repeated exam of abdomen showed no abdominal pain.  Diet advanced without difficulty.  Consults: IM  Significant Diagnostic Studies: CT scan  Discharge Exam: Blood pressure 117/72, pulse 120, temperature 99.4 F (37.4 C), temperature source Oral, resp. rate 20, height 5\' 6"  (1.676 m), weight 184 lb 3.2 oz (83.553 kg), SpO2 99 %. Soft non-tender abdomen.   Disposition: 01-Home or Self Care  Discharge Instructions    Call MD for:  persistant nausea and vomiting    Complete by:  As directed      Call MD for:  severe uncontrolled pain    Complete by:  As directed      Call MD for:  temperature >100.4    Complete by:  As directed      Diet - low sodium heart healthy    Complete by:  As directed      Increase activity slowly    Complete by:  As directed             Medication List    TAKE these medications        acetaminophen 325 MG tablet  Commonly known as:  TYLENOL  Take 650 mg by mouth every 6 (six) hours as needed.     amoxicillin-clavulanate 500-125 MG tablet  Commonly known as:  AUGMENTIN  Take 1 tablet (500 mg total) by mouth every 8 (eight) hours.         Signed: Natale LayMark Janeka Libman, MD FACS. 03/09/2015, 1:19 AM

## 2015-10-29 ENCOUNTER — Emergency Department
Admission: EM | Admit: 2015-10-29 | Discharge: 2015-10-29 | Disposition: A | Discharge status: Home / Self Care | Payer: Self-pay | Attending: Emergency Medicine | Admitting: Emergency Medicine

## 2015-10-29 DIAGNOSIS — F17210 Nicotine dependence, cigarettes, uncomplicated: Secondary | ICD-10-CM | POA: Insufficient documentation

## 2015-10-29 DIAGNOSIS — K529 Noninfective gastroenteritis and colitis, unspecified: Secondary | ICD-10-CM | POA: Insufficient documentation

## 2015-10-29 DIAGNOSIS — I10 Essential (primary) hypertension: Secondary | ICD-10-CM | POA: Insufficient documentation

## 2015-10-29 DIAGNOSIS — H1133 Conjunctival hemorrhage, bilateral: Secondary | ICD-10-CM | POA: Insufficient documentation

## 2015-10-29 DIAGNOSIS — R110 Nausea: Secondary | ICD-10-CM

## 2015-10-29 LAB — COMPREHENSIVE METABOLIC PANEL
ALK PHOS: 110 U/L (ref 38–126)
ALT: 43 U/L (ref 17–63)
AST: 42 U/L — AB (ref 15–41)
Albumin: 4.9 g/dL (ref 3.5–5.0)
Anion gap: 8 (ref 5–15)
BILIRUBIN TOTAL: 0.1 mg/dL — AB (ref 0.3–1.2)
BUN: 14 mg/dL (ref 6–20)
CHLORIDE: 106 mmol/L (ref 101–111)
CO2: 27 mmol/L (ref 22–32)
Calcium: 9.3 mg/dL (ref 8.9–10.3)
Creatinine, Ser: 0.94 mg/dL (ref 0.61–1.24)
GFR calc Af Amer: >60 mL/min (ref >60)
Glucose, Bld: 102 mg/dL — ABNORMAL HIGH (ref 65–99)
POTASSIUM: 3.4 mmol/L — AB (ref 3.5–5.1)
Sodium: 141 mmol/L (ref 135–145)
Total Protein: 8.1 g/dL (ref 6.5–8.1)

## 2015-10-29 LAB — URINALYSIS COMPLETE WITH MICROSCOPIC (ARMC ONLY)
Bacteria, UA: NONE SEEN
Bilirubin Urine: NEGATIVE
Glucose, UA: NEGATIVE mg/dL
HGB URINE DIPSTICK: NEGATIVE
KETONES UR: NEGATIVE mg/dL
LEUKOCYTES UA: NEGATIVE
Nitrite: NEGATIVE
PH: 5 (ref 5.0–8.0)
PROTEIN: NEGATIVE mg/dL
SPECIFIC GRAVITY, URINE: 1.03 (ref 1.005–1.030)

## 2015-10-29 LAB — CBC
HEMATOCRIT: 42.4 % (ref 40.0–52.0)
HEMOGLOBIN: 15 g/dL (ref 13.0–18.0)
MCH: 33.5 pg (ref 26.0–34.0)
MCHC: 35.4 g/dL (ref 32.0–36.0)
MCV: 94.5 fL (ref 80.0–100.0)
PLATELETS: 154 10*3/uL (ref 150–440)
RBC: 4.49 MIL/uL (ref 4.40–5.90)
RDW: 11.8 % (ref 11.5–14.5)
WBC: 9.1 10*3/uL (ref 3.8–10.6)

## 2015-10-29 LAB — LIPASE, BLOOD: LIPASE: 33 U/L (ref 11–51)

## 2015-10-29 MED ORDER — HYDROCHLOROTHIAZIDE 25 MG PO TABS: 25.0000 mg | ORAL_TABLET | Freq: Every day | ORAL | 0 refills | Status: DC

## 2015-10-29 MED ORDER — ONDANSETRON 4 MG PO TBDP
4.0000 mg | ORAL_TABLET | Freq: Once | ORAL | Status: AC
  Administered 2015-10-29: 4 mg via ORAL
  Filled 2015-10-29: qty 1

## 2015-10-29 MED ORDER — ONDANSETRON 4 MG PO TBDP: 4.0000 mg | ORAL_TABLET | Freq: Three times a day (TID) | ORAL | 0 refills | Status: DC | PRN

## 2015-10-29 NOTE — ED Provider Notes (Signed)
[facility] Emergency Department Provider Note   ____________________________________________   First MD Initiated Contact with Patient 10/29/15 [phone]     (approximate)  I have reviewed the triage vital signs and the nursing notes.   HISTORY  Chief Complaint Emesis    HPI Clinton Campbell is a 20 y.o. male who presents to the ED from home with a chief complain of nausea and ruptured blood vessels in both eyes. Patient reports 3-4 episodes of vomiting 2 days ago. He has had none since but has had residual nausea. Able to tolerate food. States he has had diarrhea for "a minute".2 days ago after vomiting patient noted ruptured blood vessels to his left eye. Yesterday noted ruptured blood vessel in his right eye. Denies vision changes, headache, fever, chills, neck pain, chest pain, shortness of breath, abdominal pain, dysuria, testicular pain or swelling. Denies recent travel or trauma. Nothing makes his symptoms better or worse.   Past Medical History:  Diagnosis Date  . Hypertension     Patient Active Problem List   Diagnosis Date Noted  . Abdominal pain 02/26/2015  . Acute appendicitis with localized peritonitis   . Essential hypertension   . Cephalalgia     No past surgical history on file.  Prior to Admission medications   Medication Sig Start Date End Date Taking? Authorizing Provider  acetaminophen (TYLENOL) 325 MG tablet Take 650 mg by mouth every 6 (six) hours as needed.    Historical Provider, MD  amoxicillin-clavulanate (AUGMENTIN) 500-125 MG tablet Take 1 tablet (500 mg total) by mouth every 8 (eight) hours. 02/26/15   Natale Lay, MD  hydrochlorothiazide (HYDRODIURIL) 25 MG tablet Take 1 tablet (25 mg total) by mouth daily. 10/29/15   Irean Hong, MD    Allergies Review of patient's allergies indicates no known allergies.  No family history on file.  Social History Social History  Substance Use Topics  . Smoking status: Current  Every Day Smoker    Packs/day: 0.50    Types: Cigarettes  . Smokeless tobacco: Not on file  . Alcohol use No    Review of Systems  Constitutional: No fever/chills.  Eyes: Positive for ruptured blood vessels in both eyes. No visual changes. ENT: No sore throat. Cardiovascular: Denies chest pain. Respiratory: Denies shortness of breath. Gastrointestinal: No abdominal pain.  Positive for nausea, vomiting and diarrhea.  No constipation. Genitourinary: Negative for dysuria. Musculoskeletal: Negative for back pain. Skin: Negative for rash. Neurological: Negative for headaches, focal weakness or numbness.  10-point ROS otherwise negative.  ____________________________________________   PHYSICAL EXAM:  VITAL SIGNS: ED Triage Vitals  Enc Vitals Group     BP 10/29/15 0110 (!) 167/113     Pulse Rate 10/29/15 0110 79     Resp 10/29/15 0110 18     Temp 10/29/15 0110 98.7 F (37.1 C)     Temp Source 10/29/15 0110 Oral     SpO2 10/29/15 0110 100 %     Weight 10/29/15 0111 170 lb (77.1 kg)     Height 10/29/15 0111 5\' 8"  (1.727 m)     Head Circumference --      Peak Flow --      Pain Score --      Pain Loc --      Pain Edu? --      Excl. in GC? --     Constitutional: Alert and oriented. Well appearing and in no acute distress. Eyes: Bilateral small subconjunctival hemorrhages. PERRL. EOMI.  Head: Atraumatic. Nose: No congestion/rhinnorhea. Mouth/Throat: Mucous membranes are moist.  Oropharynx non-erythematous. Neck: No stridor.   Cardiovascular: Normal rate, regular rhythm. Grossly normal heart sounds.  Good peripheral circulation. Respiratory: Normal respiratory effort.  No retractions. Lungs CTAB. Gastrointestinal: Soft and nontender to both light and deep palpation. No distention. No abdominal bruits. No CVA tenderness. Musculoskeletal: No lower extremity tenderness nor edema.  No joint effusions. Neurologic:  Normal speech and language. No gross focal neurologic deficits  are appreciated. No gait instability. Skin:  Skin is warm, dry and intact. No rash noted. Psychiatric: Mood and affect are normal. Speech and behavior are normal.  ____________________________________________   LABS (all labs ordered are listed, but only abnormal results are displayed)  Labs Reviewed  COMPREHENSIVE METABOLIC PANEL - Abnormal; Notable for the following:       Result Value   Potassium 3.4 (*)    Glucose, Bld 102 (*)    AST 42 (*)    Total Bilirubin 0.1 (*)    All other components within normal limits  URINALYSIS COMPLETEWITH MICROSCOPIC ([facility] ONLY) - Abnormal; Notable for the following:    Color, Urine YELLOW (*)    APPearance CLEAR (*)    Squamous Epithelial / LPF 0-5 (*)    All other components within normal limits  CBC  LIPASE, BLOOD   ____________________________________________  EKG  None ____________________________________________  RADIOLOGY  None ____________________________________________   PROCEDURES  Procedure(s) performed: None  Procedures  Critical Care performed: No  ____________________________________________   INITIAL IMPRESSION / ASSESSMENT AND PLAN / ED COURSE  Pertinent labs & imaging results that were available during my care of the patient were reviewed by me and considered in my medical decision making (see chart for details).  20 year old male presents with residual nausea after several episodes of vomiting and diarrhea 2 days ago. Also reports bilateral subconjunctival hemorrhages without affecting his vision. Will administer ODT Zofran. Laboratory urinalysis results are unremarkable. Abdomen is benign to both light and deep palpation; do not feel imaging studies are warranted at this time. Patient has been unable to afford his antihypertensive; will change to generic prescription on the [facility] $4 list. Patient to follow-up with both his PCP as well as ophthalmology next week. Strict return precautions given. Patient  verbalizes understanding and agrees with plan of care.  Clinical Course     ____________________________________________   FINAL CLINICAL IMPRESSION(S) / ED DIAGNOSES  Final diagnoses:  Nausea  Subconjunctival hemorrhage of both eyes  Gastroenteritis  Essential hypertension      NEW MEDICATIONS STARTED DURING THIS VISIT:  New Prescriptions   HYDROCHLOROTHIAZIDE (HYDRODIURIL) 25 MG TABLET    Take 1 tablet (25 mg total) by mouth daily.     Note:  This document was prepared using Dragon voice recognition software and may include unintentional dictation errors.    Irean HongJade J Mirel Hundal, MD 10/29/15 [phone]

## 2015-10-29 NOTE — ED Notes (Addendum)
Pt comes in reporting that two nights ago he had 4-5 episodes of vomiting, and in the last 2 or 3 episodes he had bright red blood in the emesis. Pt reports that since then he has had the feeling like he needs to vomit but he hasn't. Also reports that, "My stomach feels weak and like it's being pulled apart or like it's melting." After talking with the pt further, pt reports that he has been having loose, watery stools for quite awhile. Pt denies any blood in them, nor are they dark or tarry. Abd is not distended nor tender at time of assessment. Pt also reports that after the vomiting he noticed that he had some blood vessels in both eyes that were burst and that they have been concerning him. Pt is hypertensive, has a hx of HTN. Has been on medication in the past but is not currently on anything because the medications were $90/month and he could not afford that anymore. Pt reports that prior to having the vomiting he was having a headache as well as dizziness at times.

## 2015-10-29 NOTE — ED Notes (Signed)
Discharge instructions reviewed with patient. Patient verbalized understanding. Patient ambulated to lobby without difficulty.   

## 2015-10-29 NOTE — ED Triage Notes (Signed)
Pt vomited  2 days ago none since then but has had nausea. No diarrhea, no abd pain, but has ruptured blood vessels in both eyes that worsened today.

## 2015-10-29 NOTE — Discharge Instructions (Signed)
1. You may take nausea medicine as needed (Zofran #20). °2. Clear liquids ×12 hours, then BRAT diet ×3 days, then slowly advance diet as tolerated. °3. Return to the ER for worsening symptoms, persistent vomiting, difficulty breathing or other concerns. °

## 2016-05-03 ENCOUNTER — Emergency Department
Admission: EM | Admit: 2016-05-03 | Discharge: 2016-05-03 | Disposition: A | Discharge status: Home / Self Care | Payer: Self-pay | Attending: Emergency Medicine | Admitting: Emergency Medicine

## 2016-05-03 ENCOUNTER — Encounter: Payer: Self-pay | Admitting: *Deleted

## 2016-05-03 DIAGNOSIS — J029 Acute pharyngitis, unspecified: Secondary | ICD-10-CM | POA: Insufficient documentation

## 2016-05-03 DIAGNOSIS — F17210 Nicotine dependence, cigarettes, uncomplicated: Secondary | ICD-10-CM | POA: Insufficient documentation

## 2016-05-03 DIAGNOSIS — I10 Essential (primary) hypertension: Secondary | ICD-10-CM | POA: Insufficient documentation

## 2016-05-03 LAB — POCT RAPID STREP A: Streptococcus, Group A Screen (Direct): NEGATIVE

## 2016-05-03 MED ORDER — IBUPROFEN 800 MG PO TABS: 800.0000 mg | ORAL_TABLET | Freq: Three times a day (TID) | ORAL | 0 refills | Status: DC | PRN

## 2016-05-03 MED ORDER — IBUPROFEN 400 MG PO TABS
ORAL_TABLET | ORAL | Status: AC
  Filled 2016-05-03: qty 1

## 2016-05-03 MED ORDER — IBUPROFEN 800 MG PO TABS
800.0000 mg | ORAL_TABLET | Freq: Once | ORAL | Status: AC
  Administered 2016-05-03: 800 mg via ORAL

## 2016-05-03 MED ORDER — IBUPROFEN 800 MG PO TABS
ORAL_TABLET | ORAL | Status: AC
  Filled 2016-05-03: qty 1

## 2016-05-03 NOTE — ED Triage Notes (Signed)
Pt has sore throat for 3 days.  Pt reports pain when swallowing.  Pt also has left earache.  Pt alert

## 2016-05-03 NOTE — ED Provider Notes (Signed)
[facility]-EMERGENCY DEPARTMENT Provider Note   CSN: [id number] Arrival date & time: 05/03/16  2205     History   Chief Complaint Chief Complaint  Patient presents with  . Sore Throat    HPI Clinton Campbell is a 21 y.o. male presents to the emergency department for evaluation of sore throat 3 days. Patient has had moderate to severe sore throat with mild cough one day ago he's also had some left ear pain. Patient has had subjective low-grade fevers. He last took ibuprofen yesterday. No ibuprofen today. No difficulty swallowing. No chest pain or shortness of breath. Patient denies any headaches, body aches or rashes.  HPI  Past Medical History:  Diagnosis Date  . Hypertension     Patient Active Problem List   Diagnosis Date Noted  . Abdominal pain 02/26/2015  . Acute appendicitis with localized peritonitis   . Essential hypertension   . Cephalalgia     No past surgical history on file.     Home Medications    Prior to Admission medications   Medication Sig Start Date End Date Taking? Authorizing Provider  acetaminophen (TYLENOL) 325 MG tablet Take 650 mg by mouth every 6 (six) hours as needed.    Historical Provider, MD  amoxicillin-clavulanate (AUGMENTIN) 500-125 MG tablet Take 1 tablet (500 mg total) by mouth every 8 (eight) hours. 02/26/15   Natale LayMark Bird, MD  hydrochlorothiazide (HYDRODIURIL) 25 MG tablet Take 1 tablet (25 mg total) by mouth daily. 10/29/15   Irean HongJade J Sung, MD  ibuprofen (ADVIL,MOTRIN) 800 MG tablet Take 1 tablet (800 mg total) by mouth every 8 (eight) hours as needed. 05/03/16   Evon Slackhomas C Gaines, PA-C  ondansetron (ZOFRAN ODT) 4 MG disintegrating tablet Take 1 tablet (4 mg total) by mouth every 8 (eight) hours as needed for nausea or vomiting. 10/29/15   Irean HongJade J Sung, MD    Family History No family history on file.  Social History Social History  Substance Use Topics  . Smoking status: Current Every Day Smoker    Packs/day: 0.50    Types: Cigarettes    . Smokeless tobacco: Never Used  . Alcohol use No     Allergies   Patient has no known allergies.   Review of Systems Review of Systems  Constitutional: Positive for fever (subjective). Negative for activity change, appetite change and chills.  HENT: Positive for rhinorrhea and sore throat. Negative for congestion, drooling, ear pain, mouth sores, sinus pressure and trouble swallowing.   Eyes: Negative for photophobia, pain and discharge.  Respiratory: Positive for cough. Negative for chest tightness and shortness of breath.   Cardiovascular: Negative for chest pain and leg swelling.  Gastrointestinal: Negative for abdominal distention, abdominal pain, diarrhea, nausea and vomiting.  Genitourinary: Negative for difficulty urinating and dysuria.  Musculoskeletal: Negative for arthralgias, back pain and gait problem.  Skin: Negative for color change and rash.  Neurological: Negative for dizziness and headaches.  Hematological: Negative for adenopathy.  Psychiatric/Behavioral: Negative for agitation and behavioral problems.     Physical Exam Updated Vital Signs BP (!) 153/80 (BP Location: Left Arm)   Pulse 96   Temp 99.2 F (37.3 C) (Oral)   Resp 18   Ht 5\' 8"  (1.727 m)   Wt 74.4 kg   SpO2 99%   BMI 24.94 kg/m   Physical Exam  Constitutional: He is oriented to person, place, and time. He appears well-developed and well-nourished.  HENT:  Head: Normocephalic and atraumatic.  Right Ear: External ear normal.  Left Ear: External ear normal.  Mouth/Throat: No uvula swelling. Posterior oropharyngeal erythema present. No oropharyngeal exudate, posterior oropharyngeal edema or tonsillar abscesses.  Eyes: Conjunctivae and EOM are normal. Pupils are equal, round, and reactive to light.  Neck: Normal range of motion. Neck supple.  Cardiovascular: Normal rate, regular rhythm, normal heart sounds and intact distal pulses.   Pulmonary/Chest: Effort normal and breath sounds normal.  No respiratory distress. He has no wheezes. He has no rales. He exhibits no tenderness.  Abdominal: Soft. Bowel sounds are normal. He exhibits no distension and no mass. There is no tenderness. There is no rebound and no guarding.  Musculoskeletal: Normal range of motion. He exhibits no edema or tenderness.  Lymphadenopathy:    He has cervical adenopathy (posterior cervical).  Neurological: He is alert and oriented to person, place, and time.  Skin: Skin is warm and dry.  Psychiatric: He has a normal mood and affect. His behavior is normal. Judgment and thought content normal.     ED Treatments / Results  Labs (all labs ordered are listed, but only abnormal results are displayed) Labs Reviewed  POCT RAPID STREP A    EKG  EKG Interpretation None       Radiology No results found.  Procedures Procedures (including critical care time)  Medications Ordered in ED Medications  ibuprofen (ADVIL,MOTRIN) tablet 800 mg (800 mg Oral Given 05/03/16 2226)     Initial Impression / Assessment and Plan / ED Course  I have reviewed the triage vital signs and the nursing notes.  Pertinent labs & imaging results that were available during my care of the patient were reviewed by me and considered in my medical decision making (see chart for details).     21 year old male with viral pharyngitis. He will alternate Tylenol and ibuprofen as needed. Increase fluids. Educated on signs and symptoms return to the ED for.  Final Clinical Impressions(s) / ED Diagnoses   Final diagnoses:  Viral pharyngitis    New Prescriptions New Prescriptions   IBUPROFEN (ADVIL,MOTRIN) 800 MG TABLET    Take 1 tablet (800 mg total) by mouth every 8 (eight) hours as needed.     Evon Slack, PA-C 05/03/16 2244    Governor Rooks, MD 05/05/16 [phone]

## 2016-05-03 NOTE — Discharge Instructions (Signed)
Please alternate Tylenol and ibuprofen as needed for sore throat. Return to the ER for difficulty swallowing fevers above 102 with no improvement with Tylenol or ibuprofen, worsening symptoms urgent changes in her health.

## 2017-09-24 ENCOUNTER — Other Ambulatory Visit: Payer: Self-pay

## 2017-09-24 ENCOUNTER — Emergency Department
Admission: EM | Admit: 2017-09-24 | Discharge: 2017-09-24 | Disposition: A | Discharge status: Home / Self Care | Payer: Self-pay | Attending: Emergency Medicine | Admitting: Emergency Medicine

## 2017-09-24 ENCOUNTER — Encounter: Payer: Self-pay | Admitting: Intensive Care

## 2017-09-24 DIAGNOSIS — Z79899 Other long term (current) drug therapy: Secondary | ICD-10-CM | POA: Insufficient documentation

## 2017-09-24 DIAGNOSIS — I10 Essential (primary) hypertension: Secondary | ICD-10-CM | POA: Insufficient documentation

## 2017-09-24 DIAGNOSIS — F410 Panic disorder [episodic paroxysmal anxiety] without agoraphobia: Secondary | ICD-10-CM | POA: Insufficient documentation

## 2017-09-24 DIAGNOSIS — F17210 Nicotine dependence, cigarettes, uncomplicated: Secondary | ICD-10-CM | POA: Insufficient documentation

## 2017-09-24 MED ORDER — HYDROCHLOROTHIAZIDE 25 MG PO TABS
25.0000 mg | ORAL_TABLET | Freq: Once | ORAL | Status: AC
  Administered 2017-09-24: 25 mg via ORAL
  Filled 2017-09-24 (×2): qty 1

## 2017-09-24 MED ORDER — HYDROCHLOROTHIAZIDE 25 MG PO TABS: 25.0000 mg | ORAL_TABLET | Freq: Every day | ORAL | 1 refills | Status: DC

## 2017-09-24 NOTE — ED Notes (Signed)
Pt reports his body feels "jittery" although he is now calm. Pt states he had argued with his brother and was sitting at the table when he started breathing very hard. Then pt remembers being at fire dept, them telling him his BP is high and to come to ER. Pt states last time he had anxiety attack in 2013. Denies N/V. Reports headache at this time.

## 2017-09-24 NOTE — ED Notes (Signed)
Pt ambulatory upon discharge with family; declined wheel chair. Pt and family verbalized understanding of discharge instructions, follow-up care and prescription. VSS. Skin warm and dry. A&O x4.

## 2017-09-24 NOTE — ED Provider Notes (Signed)
[facility] Emergency Department Provider Note  ____________________________________________  Time seen: Approximately 5:27 PM  I have reviewed the triage vital signs and the nursing notes.   HISTORY  Chief Complaint Panic Attack   HPI Clinton Campbell is a 22 y.o. male with a history of hypertension who presents for evaluation of a panic attack.  Patient was having an argument with his brother at home when he started hyperventilating, shaking, crying inconsolably.  Symptoms were constant for several minutes and severe. Mother drove him to the fire department where he was found to have elevated blood pressure of 180/110 and they told patient to come to the emergency room.  Patient now feels like he is calm and back to baseline.  He does have a history of hypertension but has not been taking his medications because he cannot afford it.  Patient denies headache, dizziness, chest pain, shortness of breath, syncope.  He denies any suicidal homicidal ideations.  He denies any drug or alcohol use.  Past Medical History:  Diagnosis Date  . Hypertension     Patient Active Problem List   Diagnosis Date Noted  . Abdominal pain 02/26/2015  . Acute appendicitis with localized peritonitis   . Essential hypertension   . Cephalalgia     History reviewed. No pertinent surgical history.  Prior to Admission medications   Medication Sig Start Date End Date Taking? Authorizing Provider  acetaminophen (TYLENOL) 325 MG tablet Take 650 mg by mouth every 6 (six) hours as needed.    [provider]  amoxicillin-clavulanate (AUGMENTIN) 500-125 MG tablet Take 1 tablet (500 mg total) by mouth every 8 (eight) hours. 02/26/15   Natale Lay, MD  hydrochlorothiazide (HYDRODIURIL) 25 MG tablet Take 1 tablet (25 mg total) by mouth daily. 09/24/17   Nita Sickle, MD  ibuprofen (ADVIL,MOTRIN) 800 MG tablet Take 1 tablet (800 mg total) by mouth every 8 (eight) hours as needed.  05/03/16   Evon Slack, PA-C  ondansetron (ZOFRAN ODT) 4 MG disintegrating tablet Take 1 tablet (4 mg total) by mouth every 8 (eight) hours as needed for nausea or vomiting. 10/29/15   Irean Hong, MD    Allergies Patient has no known allergies.  History reviewed. No pertinent family history.  Social History Social History   Tobacco Use  . Smoking status: Current Every Day Smoker    Packs/day: 0.50    Types: Cigarettes  . Smokeless tobacco: Never Used  Substance Use Topics  . Alcohol use: No    Alcohol/week: 0.0 oz  . Drug use: No    Review of Systems  Constitutional: Negative for fever. Eyes: Negative for visual changes. ENT: Negative for sore throat. Neck: No neck pain  Cardiovascular: Negative for chest pain. Respiratory: Negative for shortness of breath. Gastrointestinal: Negative for abdominal pain, vomiting or diarrhea. Genitourinary: Negative for dysuria. Musculoskeletal: Negative for back pain. Skin: Negative for rash. Neurological: Negative for headaches, weakness or numbness. Psych: No SI or HI. + panic attack  ____________________________________________   PHYSICAL EXAM:  VITAL SIGNS: ED Triage Vitals  Enc Vitals Group     BP 09/24/17 1642 (!) 142/81     Pulse Rate 09/24/17 1642 84     Resp 09/24/17 1642 14     Temp 09/24/17 1642 99.2 F (37.3 C)     Temp Source 09/24/17 1642 Oral     SpO2 09/24/17 1642 97 %     Weight 09/24/17 1644 185 lb (83.9 kg)  Height 09/24/17 1644 5\' 8"  (1.727 m)     Head Circumference --      Peak Flow --      Pain Score 09/24/17 1643 0     Pain Loc --      Pain Edu? --      Excl. in GC? --     Constitutional: Alert and oriented. Well appearing and in no apparent distress. HEENT:      Head: Normocephalic and atraumatic.         Eyes: Conjunctivae are normal. Sclera is non-icteric.       Mouth/Throat: Mucous membranes are moist.       Neck: Supple with no signs of meningismus. Cardiovascular: Regular rate  and rhythm. No murmurs, gallops, or rubs. 2+ symmetrical distal pulses are present in all extremities. No JVD. Respiratory: Normal respiratory effort. Lungs are clear to auscultation bilaterally. No wheezes, crackles, or rhonchi.  Gastrointestinal: Soft, non tender, and non distended with positive bowel sounds. No rebound or guarding. Musculoskeletal: Nontender with normal range of motion in all extremities. No edema, cyanosis, or erythema of extremities. Neurologic: Normal speech and language. Face is symmetric. Moving all extremities. No gross focal neurologic deficits are appreciated. Skin: Skin is warm, dry and intact. No rash noted. Psychiatric: Mood and affect are normal. Speech and behavior are normal.  ____________________________________________   LABS (all labs ordered are listed, but only abnormal results are displayed)  Labs Reviewed - No data to display ____________________________________________  EKG  none  ____________________________________________  RADIOLOGY  none  ____________________________________________   PROCEDURES  Procedure(s) performed: None Procedures Critical Care performed:  None ____________________________________________   INITIAL IMPRESSION / ASSESSMENT AND PLAN / ED COURSE   22 y.o. male with a history of hypertension who presents for evaluation of a panic attack.  Patient is now calm and back to baseline.  No SI or HI.  His blood pressure slightly elevated but he is completely asymptomatic.  He is supposed to be on blood pressure medication but tells me that he is unable to afford it.  I explained to him that a 30-day supply of hydrochlorothiazide cost $5 and also discussed the importance of being on this medication to avoid developing kidney injury, strokes, and heart attacks.  At this time patient stable for discharge home to the care of his mother who is at the bedside with him.  I given him a dose of hydrochlorothiazide here and provided  him with a prescription.      As part of my medical decision making, I reviewed the following data within the electronic MEDICAL RECORD NUMBER History obtained from family, Nursing notes reviewed and incorporated, Old chart reviewed, Notes from prior ED visits and [place] Controlled Substance Database    Pertinent labs & imaging results that were available during my care of the patient were reviewed by me and considered in my medical decision making (see chart for details).    ____________________________________________   FINAL CLINICAL IMPRESSION(S) / ED DIAGNOSES  Final diagnoses:  Panic attack  Essential hypertension      NEW MEDICATIONS STARTED DURING THIS VISIT:  ED Discharge Orders        Ordered    hydrochlorothiazide (HYDRODIURIL) 25 MG tablet  Daily     09/24/17 1726       Note:  This document was prepared using Dragon voice recognition software and may include unintentional dictation errors.    Don PerkingVeronese, [place], MD 09/24/17 [phone]

## 2017-09-24 NOTE — ED Triage Notes (Addendum)
Mom reports getting home from work and patient had just had an argument with brother. Mom was driving patient home and reports he started "crying, shaking, and freaking out" so she drove him to the FD and they reported his b/p was high and got him to calm down. PAtient states he feels better now. Patient states he still feels worked up and nerves are bothing him. Denies HI/SI Patient normally takes HTN meds but does not have insurance so he has not gotten them refilled through PCP

## 2017-11-26 ENCOUNTER — Encounter: Payer: Self-pay | Admitting: Emergency Medicine

## 2017-11-26 ENCOUNTER — Other Ambulatory Visit: Payer: Self-pay

## 2017-11-26 ENCOUNTER — Emergency Department
Admission: EM | Admit: 2017-11-26 | Discharge: 2017-11-26 | Disposition: A | Discharge status: Home / Self Care | Payer: Self-pay | Attending: Emergency Medicine | Admitting: Emergency Medicine

## 2017-11-26 DIAGNOSIS — F17210 Nicotine dependence, cigarettes, uncomplicated: Secondary | ICD-10-CM | POA: Insufficient documentation

## 2017-11-26 DIAGNOSIS — J039 Acute tonsillitis, unspecified: Secondary | ICD-10-CM | POA: Insufficient documentation

## 2017-11-26 DIAGNOSIS — Z79899 Other long term (current) drug therapy: Secondary | ICD-10-CM | POA: Insufficient documentation

## 2017-11-26 DIAGNOSIS — I10 Essential (primary) hypertension: Secondary | ICD-10-CM | POA: Insufficient documentation

## 2017-11-26 LAB — GROUP A STREP BY PCR: Group A Strep by PCR: NOT DETECTED

## 2017-11-26 MED ORDER — AMOXICILLIN 500 MG PO CAPS: 500.0000 mg | ORAL_CAPSULE | Freq: Three times a day (TID) | ORAL | 0 refills | Status: DC

## 2017-11-26 MED ORDER — LIDOCAINE VISCOUS HCL 2 % MT SOLN: 5.0000 mL | Freq: Four times a day (QID) | OROMUCOSAL | 0 refills | Status: DC | PRN

## 2017-11-26 NOTE — ED Provider Notes (Signed)
[facility] Emergency Department Provider Note   ____________________________________________   First MD Initiated Contact with Patient 11/26/17 1232     (approximate)  I have reviewed the triage vital signs and the nursing notes.   HISTORY  Chief Complaint Sore Throat    HPI Clinton Campbell is a 22 y.o. male patient presented onset of acute sore throat for 1 day.  Patient denies URI signs and symptoms.  Patient denies being around anyone else that has been sick.  Patient states able to tolerate food and fluids with difficulty.  Patient rates his pain/ discomfort as a 7/10.  Patient describes his pain/discomfort as sore".  No palliative measures for complaint.  Past Medical History:  Diagnosis Date  . Hypertension     Patient Active Problem List   Diagnosis Date Noted  . Abdominal pain 02/26/2015  . Acute appendicitis with localized peritonitis   . Essential hypertension   . Cephalalgia     History reviewed. No pertinent surgical history.  Prior to Admission medications   Medication Sig Start Date End Date Taking? Authorizing Provider  acetaminophen (TYLENOL) 325 MG tablet Take 650 mg by mouth every 6 (six) hours as needed.    [provider]  amoxicillin (AMOXIL) 500 MG capsule Take 1 capsule (500 mg total) by mouth 3 (three) times daily. 11/26/17   Joni Reining, PA-C  amoxicillin-clavulanate (AUGMENTIN) 500-125 MG tablet Take 1 tablet (500 mg total) by mouth every 8 (eight) hours. 02/26/15   Natale Lay, MD  hydrochlorothiazide (HYDRODIURIL) 25 MG tablet Take 1 tablet (25 mg total) by mouth daily. 09/24/17   Nita Sickle, MD  ibuprofen (ADVIL,MOTRIN) 800 MG tablet Take 1 tablet (800 mg total) by mouth every 8 (eight) hours as needed. 05/03/16   Evon Slack, PA-C  lidocaine (XYLOCAINE) 2 % solution Use as directed 5 mLs in the mouth or throat every 6 (six) hours as needed for mouth pain. For oral swish. 11/26/17   Joni Reining, PA-C  ondansetron (ZOFRAN ODT) 4 MG disintegrating tablet Take 1 tablet (4 mg total) by mouth every 8 (eight) hours as needed for nausea or vomiting. 10/29/15   Irean Hong, MD    Allergies Patient has no known allergies.  No family history on file.  Social History Social History   Tobacco Use  . Smoking status: Current Every Day Smoker    Packs/day: 0.50    Types: Cigarettes  . Smokeless tobacco: Never Used  Substance Use Topics  . Alcohol use: No    Alcohol/week: 0.0 standard drinks  . Drug use: No    Review of Systems Constitutional: No fever/chills Eyes: No visual changes. ENT: Sore throat. Cardiovascular: Denies chest pain. Respiratory: Denies shortness of breath. Gastrointestinal: No abdominal pain.  No nausea, no vomiting.  No diarrhea.  No constipation. Genitourinary: Negative for dysuria. Musculoskeletal: Negative for back pain. Skin: Negative for rash. Neurological: Negative for headaches, focal weakness or numbness.   ____________________________________________   PHYSICAL EXAM:  VITAL SIGNS: ED Triage Vitals [11/26/17 1159]  Enc Vitals Group     BP      Pulse      Resp      Temp      Temp src      SpO2      Weight 184 lb 15.5 oz (83.9 kg)     Height      Head Circumference      Peak Flow  Pain Score 7     Pain Loc      Pain Edu?      Excl. in GC?    Constitutional: Alert and oriented. Well appearing and in no acute distress. Eyes: Conjunctivae are normal. PERRL. EOMI. Head: Atraumatic. Nose: No congestion/rhinnorhea. Mouth/Throat: Mucous membranes are moist.  Oropharynx erythematous.  Edematous exudative bilateral tonsils. Neck: No stridor.  No cervical spine tenderness to palpation. Hematological/Lymphatic/Immunilogical: No cervical lymphadenopathy. Cardiovascular: Normal rate, regular rhythm. Grossly normal heart sounds.  Good peripheral circulation. Respiratory: Normal respiratory effort.  No retractions. Lungs CTAB. Skin:   Skin is warm, dry and intact. No rash noted. Psychiatric: Mood and affect are normal. Speech and behavior are normal.  ____________________________________________   LABS (all labs ordered are listed, but only abnormal results are displayed)  Labs Reviewed  GROUP A STREP BY PCR   ____________________________________________  EKG   ____________________________________________  RADIOLOGY  ED MD interpretation:    Official radiology report(s): No results found.  ____________________________________________   PROCEDURES  Procedure(s) performed: None  Procedures  Critical Care performed: No  ____________________________________________   INITIAL IMPRESSION / ASSESSMENT AND PLAN / ED COURSE  As part of my medical decision making, I reviewed the following data within the electronic MEDICAL RECORD NUMBER    Sore throat secondary to tonsillitis.  Patient given discharge care instruction advised take medication as directed.  Patient given a work note and advised [facility].      ____________________________________________   FINAL CLINICAL IMPRESSION(S) / ED DIAGNOSES  Final diagnoses:  Tonsillitis     ED Discharge Orders         Ordered    amoxicillin (AMOXIL) 500 MG capsule  3 times daily     11/26/17 1349    lidocaine (XYLOCAINE) 2 % solution  Every 6 hours PRN     11/26/17 1349           Note:  This document was prepared using Dragon voice recognition software and may include unintentional dictation errors.    Joni ReiningSmith, Aylen Stradford K, PA-C 11/26/17 1350    Emily FilbertWilliams, Jonathan E, MD 11/26/17 1501

## 2017-11-26 NOTE — ED Triage Notes (Signed)
Sore throat x 1 day.  AAOx3.   Skin warm and dry. NAD

## 2018-10-25 ENCOUNTER — Emergency Department: Payer: Self-pay

## 2018-10-25 ENCOUNTER — Emergency Department
Admission: EM | Admit: 2018-10-25 | Discharge: 2018-10-25 | Disposition: A | Discharge status: Home / Self Care | Payer: Self-pay | Attending: Emergency Medicine | Admitting: Emergency Medicine

## 2018-10-25 ENCOUNTER — Other Ambulatory Visit: Payer: Self-pay

## 2018-10-25 DIAGNOSIS — R42 Dizziness and giddiness: Secondary | ICD-10-CM | POA: Insufficient documentation

## 2018-10-25 DIAGNOSIS — R51 Headache: Secondary | ICD-10-CM | POA: Insufficient documentation

## 2018-10-25 DIAGNOSIS — I159 Secondary hypertension, unspecified: Secondary | ICD-10-CM | POA: Insufficient documentation

## 2018-10-25 DIAGNOSIS — F17210 Nicotine dependence, cigarettes, uncomplicated: Secondary | ICD-10-CM | POA: Insufficient documentation

## 2018-10-25 DIAGNOSIS — I10 Essential (primary) hypertension: Secondary | ICD-10-CM | POA: Insufficient documentation

## 2018-10-25 DIAGNOSIS — Z79899 Other long term (current) drug therapy: Secondary | ICD-10-CM | POA: Insufficient documentation

## 2018-10-25 DIAGNOSIS — R110 Nausea: Secondary | ICD-10-CM | POA: Insufficient documentation

## 2018-10-25 DIAGNOSIS — R0789 Other chest pain: Secondary | ICD-10-CM | POA: Insufficient documentation

## 2018-10-25 LAB — CBC
HCT: 45.6 % (ref 39.0–52.0)
Hemoglobin: 15.6 g/dL (ref 13.0–17.0)
MCH: 32.8 pg (ref 26.0–34.0)
MCHC: 34.2 g/dL (ref 30.0–36.0)
MCV: 96 fL (ref 80.0–100.0)
Platelets: 161 10*3/uL (ref 150–400)
RBC: 4.75 MIL/uL (ref 4.22–5.81)
RDW: 11.2 % — ABNORMAL LOW (ref 11.5–15.5)
WBC: 10.6 10*3/uL — ABNORMAL HIGH (ref 4.0–10.5)
nRBC: 0 % (ref 0.0–0.2)

## 2018-10-25 LAB — BASIC METABOLIC PANEL
Anion gap: 10 (ref 5–15)
BUN: 7 mg/dL (ref 6–20)
CO2: 22 mmol/L (ref 22–32)
Calcium: 9.6 mg/dL (ref 8.9–10.3)
Chloride: 109 mmol/L (ref 98–111)
Creatinine, Ser: 0.83 mg/dL (ref 0.61–1.24)
GFR calc Af Amer: >60 mL/min (ref >60)
GFR calc non Af Amer: >60 mL/min (ref >60)
Glucose, Bld: 99 mg/dL (ref 70–99)
Potassium: 3.7 mmol/L (ref 3.5–5.1)
Sodium: 141 mmol/L (ref 135–145)

## 2018-10-25 LAB — TROPONIN I (HIGH SENSITIVITY): Troponin I (High Sensitivity): 4 ng/L (ref <18)

## 2018-10-25 MED ORDER — HYDROCHLOROTHIAZIDE 25 MG PO TABS: 25.0000 mg | ORAL_TABLET | Freq: Every day | ORAL | 1 refills | Status: AC

## 2018-10-25 MED ORDER — ONDANSETRON HCL 4 MG PO TABS
4.0000 mg | ORAL_TABLET | Freq: Once | ORAL | Status: AC
  Administered 2018-10-25: 4 mg via ORAL
  Filled 2018-10-25: qty 1

## 2018-10-25 MED ORDER — ONDANSETRON 4 MG PO TBDP
4.0000 mg | ORAL_TABLET | Freq: Once | ORAL | Status: AC
  Administered 2018-10-25: 22:00:00 4 mg via ORAL

## 2018-10-25 MED ORDER — HYDROCHLOROTHIAZIDE 25 MG PO TABS: 25.0000 mg | ORAL_TABLET | Freq: Every day | ORAL | 1 refills | Status: DC

## 2018-10-25 MED ORDER — SODIUM CHLORIDE 0.9% FLUSH: 3.0000 mL | Freq: Once | INTRAVENOUS | Status: DC

## 2018-10-25 NOTE — ED Triage Notes (Signed)
Pt comes with c/o CP, vomiting and hypertension. Pt states this started this am. Pt states he took one of his friends BP medications and since has felt dizzy.  Pt states SOB, and no fever or chills.

## 2018-10-25 NOTE — ED Notes (Signed)
Patient given update on wait time. Patient verbalizes understanding.  

## 2018-10-25 NOTE — ED Notes (Signed)
Pt states he has to leave and cannot wait for discharge papers. Asked for prescriptiosn to be sent to [facility].

## 2018-10-25 NOTE — ED Provider Notes (Signed)
[facility] Emergency Department Provider Note  ____________________________________________   I have reviewed the triage vital signs and the nursing notes.   HISTORY  Chief Complaint Dizziness  History limited by: Not Limited   HPI Clinton Campbell is a 23 y.o. male who presents to the emergency department today with primary complaint of dizziness.  This dizziness started today.  The patient states that when he woke up he felt like he had a headache.  He says he only gets headaches when his blood pressure was high.  He did not take his blood pressure but took 1 of his friends blood pressure medications.  He says that this was 100 mg hydralazine tablet.  Patient had been on hydrochlorothiazide at one point although states he is run out of and has not been on it for a while.  After taking the medication he started feeling dizzy.  He also developed some left-sided chest pain.  Chest pain has improved at the time of my exam.  Patient states the dizziness is also improved. Complaining of some nausea.    Records reviewed. Per medical record review patient has a history of HTN  Past Medical History:  Diagnosis Date  . Hypertension     Patient Active Problem List   Diagnosis Date Noted  . Abdominal pain 02/26/2015  . Acute appendicitis with localized peritonitis   . Essential hypertension   . Cephalalgia     History reviewed. No pertinent surgical history.  Prior to Admission medications   Medication Sig Start Date End Date Taking? Authorizing Provider  acetaminophen (TYLENOL) 325 MG tablet Take 650 mg by mouth every 6 (six) hours as needed.    [provider]  hydrochlorothiazide (HYDRODIURIL) 25 MG tablet Take 1 tablet (25 mg total) by mouth daily. 09/24/17   Rudene Re, MD    Allergies Patient has no known allergies.  No family history on file.  Social History Social History   Tobacco Use  . Smoking status: Current Every Day Smoker     Packs/day: 0.50    Types: Cigarettes  . Smokeless tobacco: Never Used  Substance Use Topics  . Alcohol use: No    Alcohol/week: 0.0 standard drinks  . Drug use: No    Review of Systems Constitutional: No fever/chills Eyes: No visual changes. ENT: No sore throat. Cardiovascular: Positive for chest pain.  Respiratory: Denies shortness of breath. Gastrointestinal: No abdominal pain.  Positive for nausea.  Genitourinary: Negative for dysuria. Musculoskeletal: Negative for back pain. Skin: Negative for rash. Neurological: Positive for headache. Positive for dizziness.  ____________________________________________   PHYSICAL EXAM:  VITAL SIGNS: ED Triage Vitals  Enc Vitals Group     BP 10/25/18 1805 (!) 142/81     Pulse Rate 10/25/18 1805 78     Resp 10/25/18 1805 18     Temp 10/25/18 1805 99.1 F (37.3 C)     Temp Source 10/25/18 1805 Oral     SpO2 10/25/18 1805 100 %     Weight 10/25/18 1803 165 lb (74.8 kg)     Height 10/25/18 1803 5\' 7"  (1.702 m)     Head Circumference --      Peak Flow --      Pain Score 10/25/18 1802 4   Constitutional: Alert and oriented.  Eyes: Conjunctivae are normal.  ENT      Head: Normocephalic and atraumatic.      Nose: No congestion/rhinnorhea.      Mouth/Throat: Mucous membranes are moist.  Neck: No stridor. Hematological/Lymphatic/Immunilogical: No cervical lymphadenopathy. Cardiovascular: Normal rate, regular rhythm.  No murmurs, rubs, or gallops.  Respiratory: Normal respiratory effort without tachypnea nor retractions. Breath sounds are clear and equal bilaterally. No wheezes/rales/rhonchi. Gastrointestinal: Soft and non tender. No rebound. No guarding.  Genitourinary: Deferred Musculoskeletal: Normal range of motion in all extremities. No lower extremity edema. Neurologic:  Normal speech and language. No gross focal neurologic deficits are appreciated.  Skin:  Skin is warm, dry and intact. No rash noted. Psychiatric: Mood  and affect are normal. Speech and behavior are normal. Patient exhibits appropriate insight and judgment.  ____________________________________________    LABS (pertinent positives/negatives)  Trop hs 4 CBC wbc 10.6, hgb 15.6, plt 161 BMP wnl  ____________________________________________   EKG  I, Phineas SemenGraydon Irine Heminger, attending physician, personally viewed and interpreted this EKG  EKG Time: 1810 Rate: 62 Rhythm: normal sinus rhythm Axis: normal Intervals: qtc 389 QRS: narrow, q waves v1, v2 ST changes: no st elevation Impression: abnormal ekg   ____________________________________________    RADIOLOGY  CXR No acute abnormality  ____________________________________________   PROCEDURES  Procedures  ____________________________________________   INITIAL IMPRESSION / ASSESSMENT AND PLAN / ED COURSE  Pertinent labs & imaging results that were available during my care of the patient were reviewed by me and considered in my medical decision making (see chart for details).   Patient presented to the emergency department today with concerns primarily for some dizziness.  Patient states that he did take 1 of his friends hypertension medications earlier today.  Do wonder if this is because the patient's symptoms.  Blood work without concerning anemia or electrolyte abnormality.  Troponin was negative.  Patient was still complaining of some nausea on my exam although felt better after Zofran. Discussed with patient that we will give patient prescription for hctz.   ____________________________________________   FINAL CLINICAL IMPRESSION(S) / ED DIAGNOSES  Final diagnoses:  Dizziness  Secondary hypertension     Note: This dictation was prepared with Dragon dictation. Any transcriptional errors that result from this process are unintentional     Phineas SemenGoodman, Timberly Yott, MD 10/25/18 [phone]

## 2018-10-25 NOTE — Discharge Instructions (Addendum)
Please seek medical attention for any high fevers, chest pain, shortness of breath, change in behavior, persistent vomiting, bloody stool or any other new or concerning symptoms.  

## 2018-10-25 NOTE — ED Notes (Signed)
Pt left prior to signing.

## 2019-01-07 ENCOUNTER — Other Ambulatory Visit: Payer: Self-pay

## 2019-01-07 ENCOUNTER — Encounter: Payer: Self-pay | Admitting: Family Medicine

## 2019-01-07 ENCOUNTER — Ambulatory Visit: Payer: Self-pay | Admitting: Family Medicine

## 2019-01-07 DIAGNOSIS — Z113 Encounter for screening for infections with a predominantly sexual mode of transmission: Secondary | ICD-10-CM

## 2019-01-07 DIAGNOSIS — F419 Anxiety disorder, unspecified: Secondary | ICD-10-CM

## 2019-01-07 DIAGNOSIS — F329 Major depressive disorder, single episode, unspecified: Secondary | ICD-10-CM

## 2019-01-07 LAB — GRAM STAIN

## 2019-01-07 NOTE — Progress Notes (Signed)
    STI clinic/screening visit  Subjective:  Clinton Campbell is a 23 y.o. male being seen today for an STI screening visit. The patient reports they do have symptoms.  Patient has the following medical conditions:   Patient Active Problem List   Diagnosis Date Noted  . Abdominal pain 02/26/2015  . Essential hypertension   . Cephalalgia      Chief Complaint  Patient presents with  . SEXUALLY TRANSMITTED DISEASE    HPI  Patient reports that he thinks that he has a warts on his penis her noted these lesion 4 days ago.  He also has mild intermittent dysuria for 1-2 months.  He denies other symptoms.  He uses condoms always.  See flowsheet for further details and programmatic requirements.    The following portions of the patient's history were reviewed and updated as appropriate: allergies, current medications, past medical history, past social history, past surgical history and problem list.  Objective:  There were no vitals filed for this visit.  Physical Exam Constitutional:      Appearance: Normal appearance.  HENT:     Mouth/Throat:     Pharynx: Oropharynx is clear. No oropharyngeal exudate.  Neck:     Musculoskeletal: Muscular tenderness present.  Abdominal:     Palpations: Abdomen is soft.     Tenderness: There is no abdominal tenderness.  Genitourinary:    Scrotum/Testes: Normal.     Comments: 3 small erythematous papules posterior glans, non-wart appearance Lymphadenopathy:     Cervical: No cervical adenopathy.  Skin:    General: Skin is warm and dry.     Findings: No lesion or rash.  Neurological:     Mental Status: He is alert.    Assessment and Plan:  Clinton Campbell is a 23 y.o. male presenting to the [facility] Department for STI screening  1. Screening examination for venereal disease Chlamydia/GC x 3 Gram stain HIV RPR Hepatitis Serology  2.  Sebaceous cysts.  Reassured client that these lesions aren't an STD.  Co. Client to  continue to use condoms always. 3.  Referral to Milton Ferguson for anxiety and depression.  Amanda's card given to client.  4.  Client interested in PrEP.  Please give information  No follow-ups on file.  No future appointments.  Hassell Done, FNP

## 2019-01-07 NOTE — Progress Notes (Signed)
Gram stain reviewed and is negative today. No treatment needed per standing order and per Hassell Done, FNP verbal order. Pt received info on Prep and Milton Ferguson, LCSW card with contact info per pt request and per Hassell Done, FNP verbal order. Provider orders completed.Ronny Bacon, RN

## 2019-01-10 LAB — HEPATITIS B SURFACE ANTIGEN

## 2019-01-13 ENCOUNTER — Telehealth: Payer: Self-pay

## 2019-01-13 NOTE — Telephone Encounter (Signed)
TC to patient. Verified ID via password/SS#. Informed of positive chlamydia and GC and need for tx. Instructed to eat before visit and have partner call for tx appt. Appt scheduled for tomorrow morning.  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 Aileen Fass, RN

## 2019-01-14 ENCOUNTER — Ambulatory Visit: Payer: Self-pay

## 2019-01-14 ENCOUNTER — Other Ambulatory Visit: Payer: Self-pay

## 2019-01-14 DIAGNOSIS — A549 Gonococcal infection, unspecified: Secondary | ICD-10-CM

## 2019-01-14 DIAGNOSIS — A749 Chlamydial infection, unspecified: Secondary | ICD-10-CM

## 2019-01-14 MED ORDER — AZITHROMYCIN 500 MG PO TABS
1000.0000 mg | ORAL_TABLET | Freq: Once | ORAL | Status: AC
  Administered 2019-01-14: 1000 mg via ORAL

## 2019-01-14 MED ORDER — CEFTRIAXONE SODIUM 250 MG IJ SOLR
250.0000 mg | Freq: Once | INTRAMUSCULAR | Status: AC
  Administered 2019-01-14: 250 mg via INTRAMUSCULAR

## 2019-01-14 NOTE — Progress Notes (Signed)
Patient tx'd for GC and Chlamydia per [facility].Aileen Fass, RN

## 2019-02-06 ENCOUNTER — Ambulatory Visit: Payer: Self-pay | Admitting: Licensed Clinical Social Worker

## 2019-02-11 ENCOUNTER — Telehealth: Payer: Self-pay | Admitting: General Practice

## 2019-02-11 NOTE — Telephone Encounter (Signed)
WANT TO SPEAK WITH SOMEONE REGARDING TR Clinton Campbell

## 2019-02-11 NOTE — Telephone Encounter (Signed)
TC to patient. Verified ID via password/SS#. Discussed all results from last STD visit. Patient reports still has bump and would like it looked at.  Appt scheduled. Aileen Fass, RN

## 2019-02-19 ENCOUNTER — Other Ambulatory Visit: Payer: Self-pay

## 2019-02-19 ENCOUNTER — Ambulatory Visit: Payer: Self-pay | Admitting: Physician Assistant

## 2019-02-19 DIAGNOSIS — B977 Papillomavirus as the cause of diseases classified elsewhere: Secondary | ICD-10-CM

## 2019-02-19 DIAGNOSIS — Z113 Encounter for screening for infections with a predominantly sexual mode of transmission: Secondary | ICD-10-CM

## 2019-02-20 ENCOUNTER — Encounter: Payer: Self-pay | Admitting: Physician Assistant

## 2019-02-20 NOTE — Progress Notes (Signed)
[facility] Department STI clinic/screening visit  Subjective:  Clinton Campbell is a 23 y.o. male being seen today for an STI screening visit. The patient reports they do have symptoms.    Patient has the following medical conditions:   Patient Active Problem List   Diagnosis Date Noted  . Abdominal pain 02/26/2015  . Essential hypertension   . Cephalalgia      Chief Complaint  Patient presents with  . SEXUALLY TRANSMITTED DISEASE    HPI  Patient reports that he has had not change in his history since his last visit and has not had sex since then but would still like a retest for GC/Chlamydia, HIV and Syphilis.  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     See flowsheet for further details and programmatic requirements.    The following portions of the patient's history were reviewed and updated as appropriate: allergies, current medications, past medical history, past social history, past surgical history and problem list.  Objective:  There were no vitals filed for this visit.  Physical Exam Constitutional:      General: He is not in acute distress.    Appearance: Normal appearance.  HENT:     Head: Normocephalic and atraumatic.     Comments: No nits, lice, or hair loss. No cervical, supraclavicular or axillary adenopathy.    Mouth/Throat:     Mouth: Mucous membranes are moist.     Pharynx: Oropharynx is clear. No oropharyngeal exudate or posterior oropharyngeal erythema.  Eyes:     Conjunctiva/sclera: Conjunctivae normal.  Pulmonary:     Effort: Pulmonary effort is normal.  Abdominal:     Palpations: Abdomen is soft. There is no mass.     Tenderness: There is no abdominal tenderness. There is no guarding or rebound.  Genitourinary:    Penis: Normal.      Testes: Normal.     Comments: Pubic area without nits, lice, edema, erythema, lesions and inguinal adenopathy. Penis circumcised and without discharge at meatus. Penis with 2 ~49mm HPV on dorsal  side below corona. Musculoskeletal:     Cervical back: Neck supple. No tenderness.  Skin:    General: Skin is warm and dry.     Findings: No bruising, erythema, lesion or rash.  Neurological:     Mental Status: He is alert and oriented to person, place, and time.  Psychiatric:        Mood and Affect: Mood normal.        Behavior: Behavior normal.        Thought Content: Thought content normal.        Judgment: Judgment normal.       Assessment and Plan:  Clinton Campbell is a 23 y.o. male presenting to the [facility] Department for STI screening  1. Screening for STD (sexually transmitted disease) Patient re-screened per his request for HIV, Syphilis, and GC/Chlamydia. Rec condoms with all sex. Await test results.  Counseled that RN will call if needs to RTC for treatment once results are back.   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  States that he has not ever had cryotx in the past.  Counseled patient re:  Risks, benefits, and SE of cryotx today.   Desires to proceed with cryotx today. WDWN male in NAD, A&O x 3; skin is warm and dry with HPV 2 47mm on dorsal side of penis below the corona.  No edema, erythema,  or ulcerative lesions present today. Cryo treatment in 3 freeze/thaw cycles today.  Patient tolerated procedure well. Reviewed with patient after-care instructions and when to call clinic. No sex until area has completely healed. Rec condoms with all sex RTC in 10-14 days for next treatment if needed.       No follow-ups on file.  No future appointments.  Jerene Dilling, PA

## 2019-03-19 NOTE — Addendum Note (Signed)
Addended by: Sadie Haber on: 03/19/2019 02:38 PM   Modules accepted: Orders

## 2019-04-14 ENCOUNTER — Other Ambulatory Visit: Payer: Self-pay

## 2019-04-14 ENCOUNTER — Ambulatory Visit: Payer: Self-pay | Admitting: Physician Assistant

## 2019-04-14 ENCOUNTER — Encounter: Payer: Self-pay | Admitting: Physician Assistant

## 2019-04-14 DIAGNOSIS — B977 Papillomavirus as the cause of diseases classified elsewhere: Secondary | ICD-10-CM

## 2019-04-14 DIAGNOSIS — Z113 Encounter for screening for infections with a predominantly sexual mode of transmission: Secondary | ICD-10-CM

## 2019-04-14 NOTE — Progress Notes (Signed)
Clinton Campbell y.o. comes into clinic today for cryotx.  Denies any problems with previous treatments.  Reports that the HPV have improved since the last treatment. States that he has 3 small HPV still that he wants treated.  Desires to proceed with cryotx today. WDWN male in NAD, A&O x 3; skin is warm and dry with 3 ~2mm HPV on dorsal side of corona of penis.  No edema, erythema, or ulcerative lesions present today. Cryo treatment in 3 freeze/thaw cycles today.  Patient tolerated procedure well. Reviewed with patient after-care instructions and when to call clinic. No sex until area has completely healed. Rec condoms with all sex RTC in 10-14 days for next treatment if needed.

## 2019-04-28 ENCOUNTER — Other Ambulatory Visit: Payer: Self-pay

## 2019-04-28 ENCOUNTER — Encounter: Payer: Self-pay | Admitting: Medical Oncology

## 2019-04-28 ENCOUNTER — Emergency Department
Admission: EM | Admit: 2019-04-28 | Discharge: 2019-04-28 | Disposition: A | Discharge status: Home / Self Care | Payer: Self-pay | Attending: Emergency Medicine | Admitting: Emergency Medicine

## 2019-04-28 DIAGNOSIS — I10 Essential (primary) hypertension: Secondary | ICD-10-CM | POA: Insufficient documentation

## 2019-04-28 DIAGNOSIS — Z79899 Other long term (current) drug therapy: Secondary | ICD-10-CM | POA: Insufficient documentation

## 2019-04-28 DIAGNOSIS — F17210 Nicotine dependence, cigarettes, uncomplicated: Secondary | ICD-10-CM | POA: Insufficient documentation

## 2019-04-28 DIAGNOSIS — K029 Dental caries, unspecified: Secondary | ICD-10-CM | POA: Insufficient documentation

## 2019-04-28 MED ORDER — LIDOCAINE VISCOUS HCL 2 % MT SOLN
15.0000 mL | Freq: Once | OROMUCOSAL | Status: AC
  Administered 2019-04-28: 15 mL via OROMUCOSAL
  Filled 2019-04-28: qty 15

## 2019-04-28 MED ORDER — AMOXICILLIN-POT CLAVULANATE 875-125 MG PO TABS: 1.0000 | ORAL_TABLET | Freq: Two times a day (BID) | ORAL | 0 refills | Status: AC

## 2019-04-28 MED ORDER — TRAMADOL HCL 50 MG PO TABS
50.0000 mg | ORAL_TABLET | Freq: Once | ORAL | Status: AC
  Administered 2019-04-28: 50 mg via ORAL
  Filled 2019-04-28: qty 1

## 2019-04-28 MED ORDER — IBUPROFEN 800 MG PO TABS: 800.0000 mg | ORAL_TABLET | Freq: Three times a day (TID) | ORAL | 0 refills | Status: AC | PRN

## 2019-04-28 MED ORDER — AMOXICILLIN-POT CLAVULANATE 875-125 MG PO TABS
1.0000 | ORAL_TABLET | Freq: Once | ORAL | Status: AC
  Administered 2019-04-28: 1 via ORAL
  Filled 2019-04-28: qty 1

## 2019-04-28 NOTE — ED Notes (Addendum)
Pt reports he has right-sided dental pain 9/10 that started about a week ago and has gotten worse.

## 2019-04-28 NOTE — ED Triage Notes (Signed)
Pt reports rt sided dental pain x 5 days.

## 2019-04-28 NOTE — ED Provider Notes (Signed)
[facility] Emergency Department Provider Note ________________   First MD Initiated Contact with Patient 04/28/19 [phone]     (approximate)  I have reviewed the triage vital signs and the nursing notes.   HISTORY  Chief Complaint Dental Pain    HPI Clinton Campbell is a 24 y.o. male presents to the emergency department secondary to right maxillary and mandibular dental caries and pain x5 days.  Patient states that current pain score is 8 out of 10.   Patient denies any fever no difficulty swallowing.        Past Medical History:  Diagnosis Date  . Hypertension     Patient Active Problem List   Diagnosis Date Noted  . Abdominal pain 02/26/2015  . Essential hypertension   . Cephalalgia     No past surgical history on file.  Prior to Admission medications   Medication Sig Start Date End Date Taking? Authorizing Provider  acetaminophen (TYLENOL) 325 MG tablet Take 650 mg by mouth every 6 (six) hours as needed.    [provider]  hydrochlorothiazide (HYDRODIURIL) 25 MG tablet Take 1 tablet (25 mg total) by mouth daily. 10/25/18   Phineas Semen, MD    Allergies Patient has no known allergies.  No family history on file.  Social History Social History   Tobacco Use  . Smoking status: Current Every Day Smoker    Packs/day: 0.25    Types: Cigarettes  . Smokeless tobacco: Never Used  Substance Use Topics  . Alcohol use: No    Alcohol/week: 0.0 standard drinks  . Drug use: No    Review of Systems Constitutional: No fever/chills Eyes: No visual changes. ENT: No sore throat.  Positive for dental caries and pain Cardiovascular: Denies chest pain. Respiratory: Denies shortness of breath. Gastrointestinal: No abdominal pain.  No nausea, no vomiting.  No diarrhea.  No constipation. Genitourinary: Negative for dysuria. Musculoskeletal: Negative for neck pain.  Negative for back pain. Integumentary: Negative for  rash. Neurological: Negative for headaches, focal weakness or numbness.  ____________________________________________   PHYSICAL EXAM:  VITAL SIGNS: ED Triage Vitals  Enc Vitals Group     BP 04/28/19 0151 124/79     Pulse Rate 04/28/19 0151 68     Resp 04/28/19 0151 19     Temp 04/28/19 0151 98.4 F (36.9 C)     Temp Source 04/28/19 0151 Oral     SpO2 04/28/19 0151 98 %     Weight 04/28/19 0147 71.2 kg (157 lb)     Height 04/28/19 0147 1.727 m (5\' 8" )     Head Circumference --      Peak Flow --      Pain Score 04/28/19 0147 8     Pain Loc --      Pain Edu? --      Excl. in GC? --     Constitutional: Alert and oriented.  Eyes: Conjunctivae are normal.  Head: Atraumatic. Mouth/Throat: Multiple right mandibular dental caries without any evidence of abscess.  2 right maxillary dental caries noted as well Neck: No stridor.  No meningeal signs.   Musculoskeletal: No lower extremity tenderness nor edema. No gross deformities of extremities. Neurologic:  Normal speech and language. No gross focal neurologic deficits are appreciated.  Skin:  Skin is warm, dry and intact. Psychiatric: Mood and affect are normal. Speech and behavior are normal.  ____________________________   PROCEDURES    Procedures   ____________________________________________   INITIAL IMPRESSION / MDM /  ASSESSMENT AND PLAN / ED COURSE  As part of my medical decision making, I reviewed the following data within the electronic MEDICAL RECORD NUMBER   24 year old male presenting with above-stated history and physical exam secondary to dental caries and dental pain.  Patient given viscous lidocaine swish and spit Augmentin and tramadol will be prescribed ibuprofen and Augmentin for home with referral to dentist  ____________________________________________  FINAL CLINICAL IMPRESSION(S) / ED DIAGNOSES  Final diagnoses:  Dental caries     MEDICATIONS GIVEN DURING THIS VISIT:  Medications  lidocaine  (XYLOCAINE) 2 % viscous mouth solution 15 mL (has no administration in time range)  amoxicillin-clavulanate (AUGMENTIN) 875-125 MG per tablet 1 tablet (has no administration in time range)  traMADol (ULTRAM) tablet 50 mg (has no administration in time range)     ED Discharge Orders    None      *Please note:  [name] was evaluated in Emergency Department on 04/28/2019 for the symptoms described in the history of present illness. He was evaluated in the context of the global COVID-19 pandemic, which necessitated consideration that the patient might be at risk for infection with the SARS-CoV-2 virus that causes COVID-19. Institutional protocols and algorithms that pertain to the evaluation of patients at risk for COVID-19 are in a state of rapid change based on information released by regulatory bodies including the CDC and federal and state organizations. These policies and algorithms were followed during the patient's care in the ED.  Some ED evaluations and interventions may be delayed as a result of limited staffing during the pandemic.*  Note:  This document was prepared using Dragon voice recognition software and may include unintentional dictation errors.   Gregor Hams, MD 04/28/19 [phone]

## 2021-03-17 IMAGING — CR CHEST - 2 VIEW
1 series · 2 of 2 positions shown · non-contrast
Comparison: None.

CLINICAL DATA: Chest pain

EXAM:
CHEST - 2 VIEW

[Series 1: dg chest 2 view · 0.14mm/px · 2 of 2 slices shown]
[im 1/2]
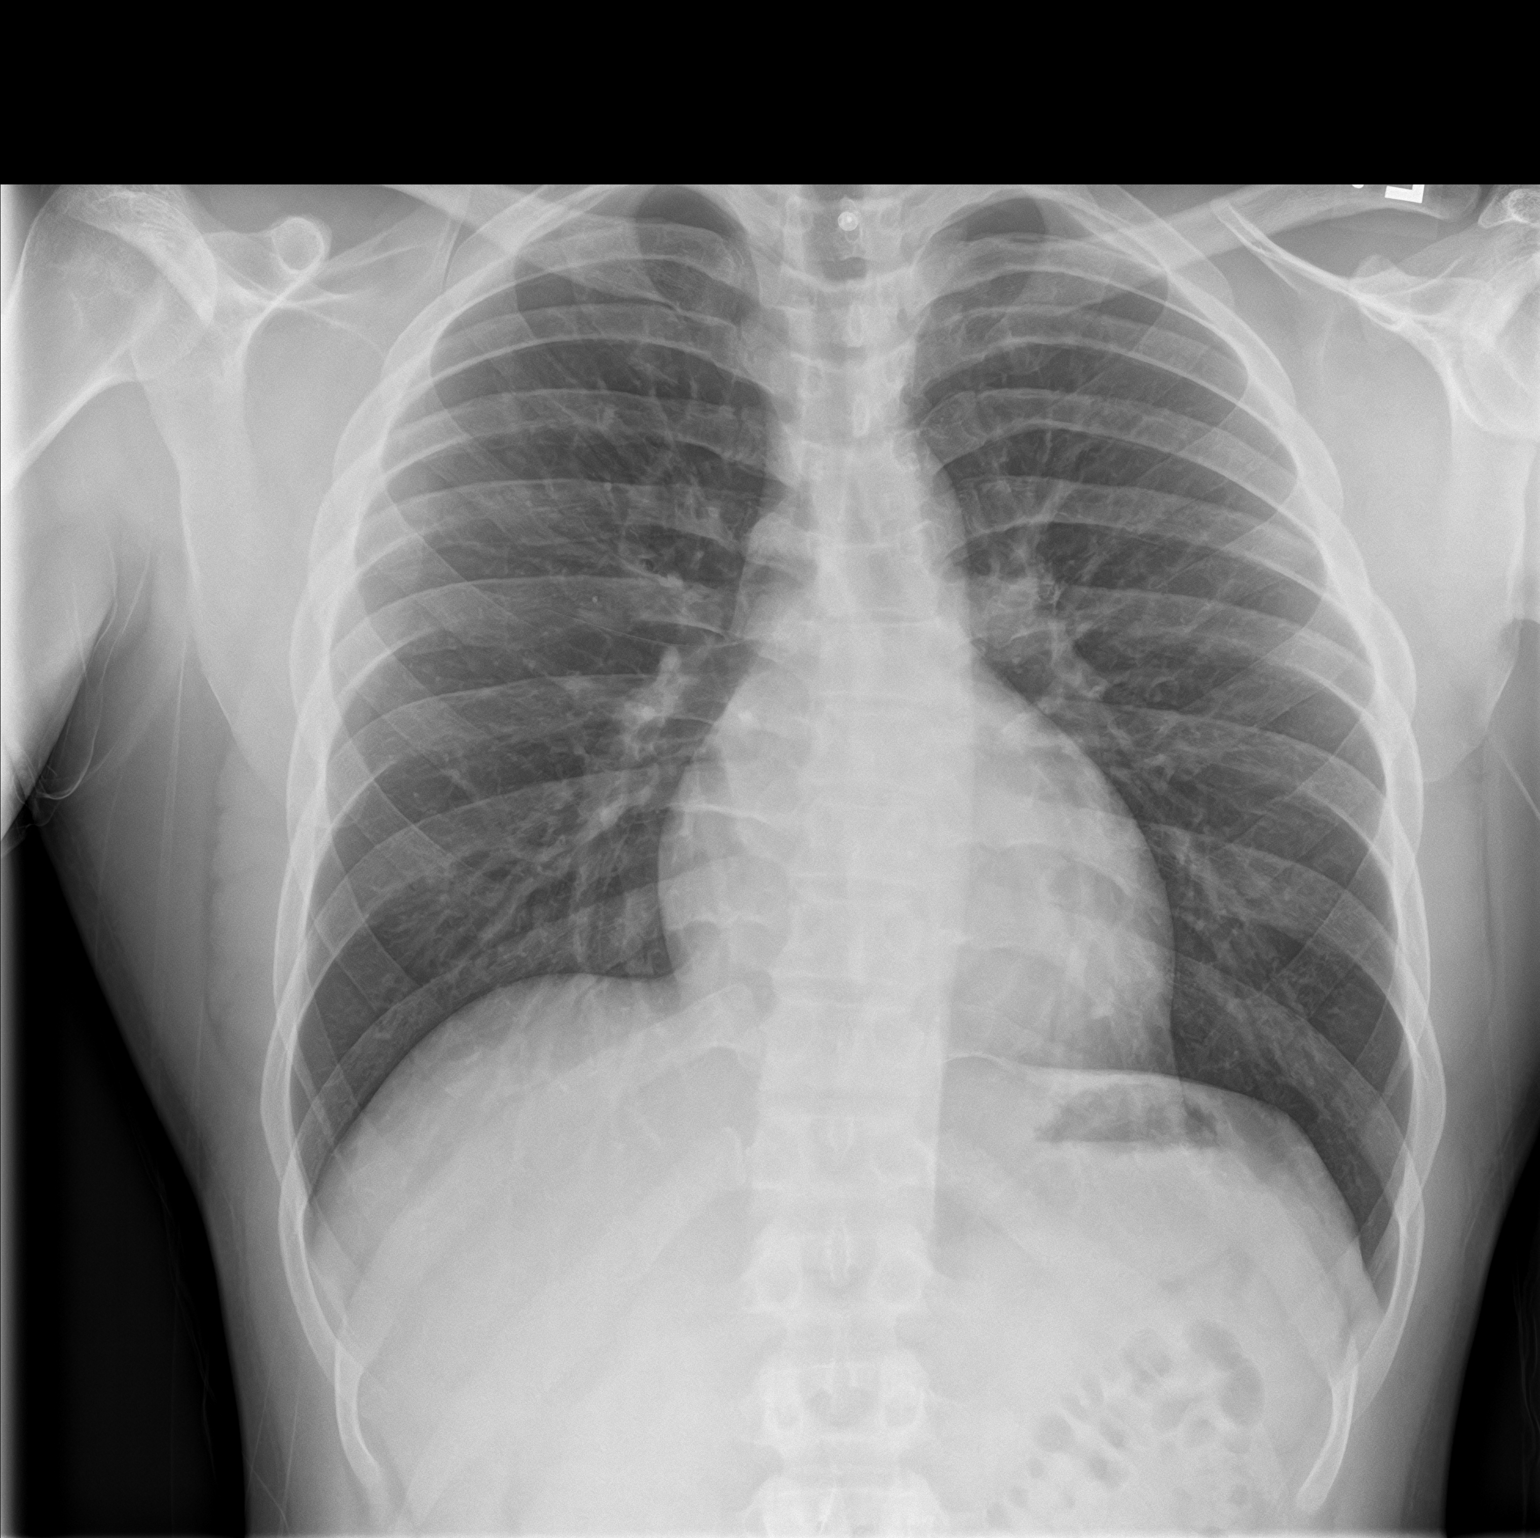
[im 2/2]
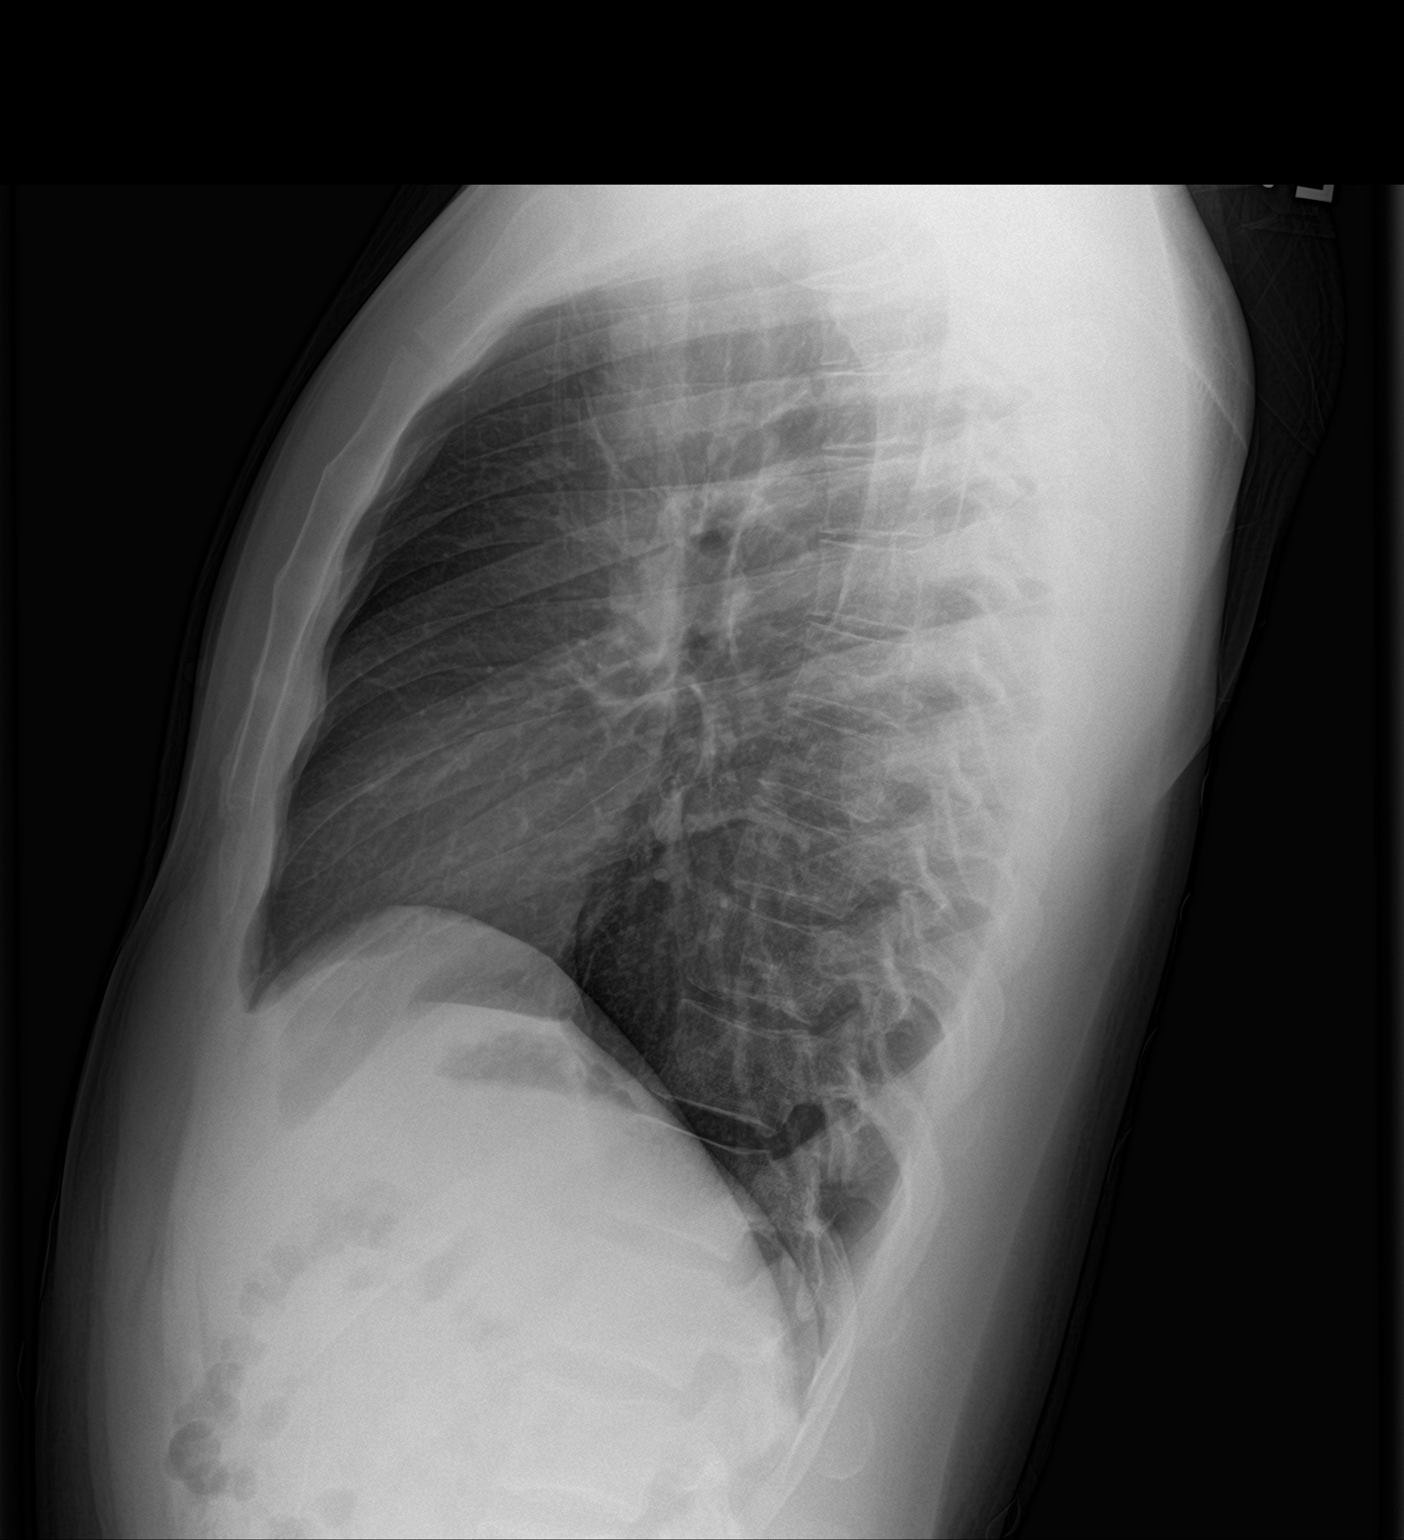

[2 of 2 positions shown; findings below may reference images not displayed]

FINDINGS: The heart size and mediastinal contours are within normal limits.
Both lungs are clear. The visualized skeletal structures are
unremarkable.
IMPRESSION: No acute abnormality of the lungs.
